# Patient Record
Sex: Female | Born: 1980 | Race: White | Hispanic: No | Marital: Married | Smoking: Never smoker
Health system: Southern US, Community
[De-identification: ages and names within clinical notes are randomized; demographics above are authoritative.]

## PROBLEM LIST (undated history)

## (undated) DIAGNOSIS — G43909 Migraine, unspecified, not intractable, without status migrainosus: Secondary | ICD-10-CM

## (undated) DIAGNOSIS — E079 Disorder of thyroid, unspecified: Secondary | ICD-10-CM

## (undated) HISTORY — PX: WISDOM TOOTH EXTRACTION: SHX21

---

## 2010-05-04 ENCOUNTER — Other Ambulatory Visit
Admission: RE | Admit: 2010-05-04 | Discharge: 2010-05-04 | Payer: Self-pay | Source: Home / Self Care | Admitting: Family Medicine

## 2014-01-17 ENCOUNTER — Encounter (HOSPITAL_COMMUNITY): Payer: Self-pay | Admitting: Emergency Medicine

## 2014-01-17 ENCOUNTER — Emergency Department (HOSPITAL_COMMUNITY)
Admission: EM | Admit: 2014-01-17 | Discharge: 2014-01-18 | Disposition: A | Discharge status: Home / Self Care | Payer: Managed Care, Other (non HMO) | Attending: Emergency Medicine | Admitting: Emergency Medicine

## 2014-01-17 DIAGNOSIS — Z8639 Personal history of other endocrine, nutritional and metabolic disease: Secondary | ICD-10-CM | POA: Diagnosis not present

## 2014-01-17 DIAGNOSIS — G43909 Migraine, unspecified, not intractable, without status migrainosus: Secondary | ICD-10-CM | POA: Insufficient documentation

## 2014-01-17 DIAGNOSIS — R51 Headache: Secondary | ICD-10-CM | POA: Insufficient documentation

## 2014-01-17 DIAGNOSIS — Z862 Personal history of diseases of the blood and blood-forming organs and certain disorders involving the immune mechanism: Secondary | ICD-10-CM | POA: Insufficient documentation

## 2014-01-17 DIAGNOSIS — R519 Headache, unspecified: Secondary | ICD-10-CM

## 2014-01-17 HISTORY — DX: Disorder of thyroid, unspecified: E07.9

## 2014-01-17 HISTORY — DX: Migraine, unspecified, not intractable, without status migrainosus: G43.909

## 2014-01-17 MED ORDER — KETOROLAC TROMETHAMINE 30 MG/ML IJ SOLN
30.0000 mg | Freq: Once | INTRAMUSCULAR | Status: AC
  Administered 2014-01-17: 30 mg via INTRAVENOUS
  Filled 2014-01-17: qty 1

## 2014-01-17 MED ORDER — METOCLOPRAMIDE HCL 5 MG/ML IJ SOLN
10.0000 mg | Freq: Once | INTRAMUSCULAR | Status: AC
Start: 2014-01-17 — End: 2014-01-17
  Administered 2014-01-17: 10 mg via INTRAVENOUS
  Filled 2014-01-17: qty 2

## 2014-01-17 MED ORDER — SODIUM CHLORIDE 0.9 % IV BOLUS (SEPSIS)
1000.0000 mL | Freq: Once | INTRAVENOUS | Status: AC
  Administered 2014-01-17: 1000 mL via INTRAVENOUS

## 2014-01-17 NOTE — Discharge Instructions (Signed)

## 2014-01-17 NOTE — ED Provider Notes (Signed)
CSN: [id number]     Arrival date & time 01/17/14  2027 History   First MD Initiated Contact with Patient 01/17/14 2135     Chief Complaint  Patient presents with  . Migraine     (Consider location/radiation/quality/duration/timing/severity/associated sxs/prior Treatment) HPI Comments: Patient presents emergency department with chief complaint of headache. She states that she has a history of headaches. She states that she is on multiple this week. She states that this current episode started around 7:00. It has progressively worsened. She states that she is tried taking ibuprofen with little relief. She denies any fevers, chills, neck stiffness, weakness, numbness, slurred speech, or difficulty seeing. She reports associated photophobia and phonophobia.  The history is provided by the patient. No language interpreter was used.    Past Medical History  Diagnosis Date  . Migraine   . Thyroid disease    History reviewed. No pertinent past surgical history. No family history on file. History  Substance Use Topics  . Smoking status: Never Smoker   . Smokeless tobacco: Not on file  . Alcohol Use: No   OB History   Grav Para Term Preterm Abortions TAB SAB Ect Mult Living       0  0 0  0     Review of Systems  Constitutional: Negative for fever and chills.  Respiratory: Negative for shortness of breath.   Cardiovascular: Negative for chest pain.  Gastrointestinal: Negative for nausea, vomiting, diarrhea and constipation.  Genitourinary: Negative for dysuria.  Neurological: Positive for headaches.  All other systems reviewed and are negative.     Allergies  Review of patient's allergies indicates no known allergies.  Home Medications   Prior to Admission medications   Not on File   BP 128/81  Pulse 70  Temp(Src) 98.5 F (36.9 C) (Oral)  Resp 20  Ht  (1.803 m)  Wt 181 lb (82.101 kg)  BMI 25.26 kg/m2  SpO2 100%  LMP 07/17/2013 Physical Exam  Nursing note and  vitals reviewed. Constitutional: She is oriented to person, place, and time. She appears well-developed and well-nourished.  HENT:  Head: Normocephalic and atraumatic.  Right Ear: External ear normal.  Left Ear: External ear normal.  Eyes: Conjunctivae and EOM are normal. Pupils are equal, round, and reactive to light.  Neck: Normal range of motion. Neck supple.  No pain with neck flexion, no meningismus  Cardiovascular: Normal rate, regular rhythm and normal heart sounds.  Exam reveals no gallop and no friction rub.   No murmur heard. Pulmonary/Chest: Effort normal and breath sounds normal. No respiratory distress. She has no wheezes. She has no rales. She exhibits no tenderness.  Abdominal: Soft. She exhibits no distension and no mass. There is no tenderness. There is no rebound and no guarding.  Musculoskeletal: Normal range of motion. She exhibits no edema and no tenderness.  Normal gait.  Neurological: She is alert and oriented to person, place, and time. She has normal reflexes.  CN 3-12 intact, normal finger to nose, no pronator drift, sensation and strength intact bilaterally.  Skin: Skin is warm and dry.  Psychiatric: She has a normal mood and affect. Her behavior is normal. Judgment and thought content normal.    ED Course  Procedures (including critical care time) Labs Review Labs Reviewed - No data to display  Imaging Review No results found.   EKG Interpretation None      MDM   Final diagnoses:  Nonintractable headache, unspecified chronicity pattern, unspecified headache type  Pt HA treated and improved while in ED.  Presentation is like pts typical HA and non concerning for [facility], ICH, Meningitis, or temporal arteritis. Pt is afebrile with no focal neuro deficits, nuchal rigidity, or change in vision. Pt is to follow up with PCP to discuss prophylactic medication. Pt verbalizes understanding and is agreeable with plan to dc.      Roxy Horseman,  PA-C 01/17/14 2340

## 2014-01-17 NOTE — ED Notes (Signed)
Pt reports progressively worsening right sided migraine since AM. Denies vision changes. Reports photophobia. Neuro intact.  Pt AO x4. NAD. Hx: Migraines.

## 2014-01-17 NOTE — ED Provider Notes (Signed)
  Medical screening examination/treatment/procedure(s) were performed by non-physician practitioner and as supervising physician I was immediately available for consultation/collaboration.   EKG Interpretation None         Preeti Winegardner, MD 01/17/14 2344 

## 2014-08-16 ENCOUNTER — Other Ambulatory Visit: Payer: Self-pay | Admitting: Family Medicine

## 2014-08-16 ENCOUNTER — Other Ambulatory Visit (HOSPITAL_COMMUNITY)
Admission: RE | Admit: 2014-08-16 | Discharge: 2014-08-16 | Disposition: A | Discharge status: Home / Self Care | Payer: Managed Care, Other (non HMO) | Source: Ambulatory Visit | Attending: Family Medicine | Admitting: Family Medicine

## 2014-08-16 DIAGNOSIS — Z01419 Encounter for gynecological examination (general) (routine) without abnormal findings: Secondary | ICD-10-CM | POA: Diagnosis present

## 2014-08-19 LAB — CYTOLOGY - PAP

## 2018-08-21 DIAGNOSIS — E039 Hypothyroidism, unspecified: Secondary | ICD-10-CM | POA: Insufficient documentation

## 2019-04-30 ENCOUNTER — Other Ambulatory Visit: Payer: Self-pay

## 2019-04-30 DIAGNOSIS — Z20822 Contact with and (suspected) exposure to covid-19: Secondary | ICD-10-CM

## 2019-05-01 LAB — NOVEL CORONAVIRUS, NAA: SARS-CoV-2, NAA: NOT DETECTED

## 2019-07-09 ENCOUNTER — Emergency Department (HOSPITAL_BASED_OUTPATIENT_CLINIC_OR_DEPARTMENT_OTHER): Payer: Managed Care, Other (non HMO)

## 2019-07-09 ENCOUNTER — Encounter (HOSPITAL_BASED_OUTPATIENT_CLINIC_OR_DEPARTMENT_OTHER): Payer: Self-pay | Admitting: *Deleted

## 2019-07-09 ENCOUNTER — Other Ambulatory Visit: Payer: Self-pay

## 2019-07-09 ENCOUNTER — Emergency Department (HOSPITAL_BASED_OUTPATIENT_CLINIC_OR_DEPARTMENT_OTHER)
Admission: EM | Admit: 2019-07-09 | Discharge: 2019-07-09 | Disposition: A | Discharge status: Home / Self Care | Payer: Managed Care, Other (non HMO) | Attending: Emergency Medicine | Admitting: Emergency Medicine

## 2019-07-09 DIAGNOSIS — Z20822 Contact with and (suspected) exposure to covid-19: Secondary | ICD-10-CM | POA: Diagnosis not present

## 2019-07-09 DIAGNOSIS — R0789 Other chest pain: Secondary | ICD-10-CM | POA: Insufficient documentation

## 2019-07-09 DIAGNOSIS — M549 Dorsalgia, unspecified: Secondary | ICD-10-CM | POA: Insufficient documentation

## 2019-07-09 DIAGNOSIS — R0602 Shortness of breath: Secondary | ICD-10-CM | POA: Insufficient documentation

## 2019-07-09 LAB — CBC
HCT: 40.7 % (ref 36.0–46.0)
Hemoglobin: 12.7 g/dL (ref 12.0–15.0)
MCH: 28.3 pg (ref 26.0–34.0)
MCHC: 31.2 g/dL (ref 30.0–36.0)
MCV: 90.8 fL (ref 80.0–100.0)
Platelets: 308 10*3/uL (ref 150–400)
RBC: 4.48 MIL/uL (ref 3.87–5.11)
RDW: 13.3 % (ref 11.5–15.5)
WBC: 8.9 10*3/uL (ref 4.0–10.5)
nRBC: 0 % (ref 0.0–0.2)

## 2019-07-09 LAB — URINALYSIS, ROUTINE W REFLEX MICROSCOPIC
Bilirubin Urine: NEGATIVE
Glucose, UA: NEGATIVE mg/dL
Ketones, ur: NEGATIVE mg/dL
Leukocytes,Ua: NEGATIVE
Nitrite: NEGATIVE
Protein, ur: NEGATIVE mg/dL
Specific Gravity, Urine: >1.03 — ABNORMAL HIGH (ref 1.005–1.030)
pH: 5.5 (ref 5.0–8.0)

## 2019-07-09 LAB — URINALYSIS, MICROSCOPIC (REFLEX)

## 2019-07-09 LAB — BASIC METABOLIC PANEL
Anion gap: 11 (ref 5–15)
BUN: 18 mg/dL (ref 6–20)
CO2: 24 mmol/L (ref 22–32)
Calcium: 9.4 mg/dL (ref 8.9–10.3)
Chloride: 102 mmol/L (ref 98–111)
Creatinine, Ser: 0.63 mg/dL (ref 0.44–1.00)
GFR calc Af Amer: >60 mL/min (ref >60)
GFR calc non Af Amer: >60 mL/min (ref >60)
Glucose, Bld: 113 mg/dL — ABNORMAL HIGH (ref 70–99)
Potassium: 3.8 mmol/L (ref 3.5–5.1)
Sodium: 137 mmol/L (ref 135–145)

## 2019-07-09 LAB — TROPONIN I (HIGH SENSITIVITY)
Troponin I (High Sensitivity): <2 ng/L (ref <18)
Troponin I (High Sensitivity): <2 ng/L (ref <18)

## 2019-07-09 LAB — PREGNANCY, URINE: Preg Test, Ur: NEGATIVE

## 2019-07-09 LAB — D-DIMER, QUANTITATIVE: D-Dimer, Quant: <0.27 ug/mL-FEU (ref 0.00–0.50)

## 2019-07-09 MED ORDER — KETOROLAC TROMETHAMINE 30 MG/ML IJ SOLN
30.0000 mg | Freq: Once | INTRAMUSCULAR | Status: AC
  Administered 2019-07-09: 30 mg via INTRAVENOUS
  Filled 2019-07-09: qty 1

## 2019-07-09 MED ORDER — IOHEXOL 350 MG/ML SOLN
75.0000 mL | Freq: Once | INTRAVENOUS | Status: AC | PRN
  Administered 2019-07-09: 75 mL via INTRAVENOUS

## 2019-07-09 MED ORDER — HYDROCODONE-ACETAMINOPHEN 5-325 MG PO TABS: 1.0000 | ORAL_TABLET | ORAL | 0 refills | Status: DC | PRN

## 2019-07-09 MED ORDER — SODIUM CHLORIDE 0.9% FLUSH
3.0000 mL | Freq: Once | INTRAVENOUS | Status: DC
  Filled 2019-07-09: qty 3

## 2019-07-09 NOTE — ED Provider Notes (Signed)
[facility] EMERGENCY DEPARTMENT Provider Note   CSN: [id number] Arrival date & time: 07/09/19  1658     History Chief Complaint  Patient presents with  . Chest Pain    Chelsea Fields is a 39 y.o. female.  Pt presents to the ED today with back pain and chest pain.  Pt said she had back pain start by her shoulder blades about 2 days ago.  She developed cp and sob about 2 hrs pta.  Pt said she took 800 mg Advil, but it did not help.  Pain is worse with movement.  No f/c.  No cough.  No known Covid exposures.  No trauma or new exercise.            Past Medical History:  Diagnosis Date  . Thyroid disease     There are no problems to display for this patient.   History reviewed. No pertinent surgical history.   OB History   No obstetric history on file.     History reviewed. No pertinent family history.  Social History   Tobacco Use  . Smoking status: Never Smoker  . Smokeless tobacco: Never Used  Substance Use Topics  . Alcohol use: Not Currently  . Drug use: Never    Home Medications Prior to Admission medications   Medication Sig Start Date End Date Taking? Authorizing Provider  thyroid (ARMOUR) 15 MG tablet Take 15 mg by mouth daily.   Yes [provider]  HYDROcodone-acetaminophen (NORCO/VICODIN) 5-325 MG tablet Take 1 tablet by mouth every 4 (four) hours as needed. 07/09/19   Jacalyn Lefevre, MD    Allergies    Patient has no known allergies.  Review of Systems   Review of Systems  Respiratory: Positive for chest tightness.   Musculoskeletal: Positive for back pain.  All other systems reviewed and are negative.   Physical Exam Updated Vital Signs BP 114/80   Pulse 67   Temp 98.6 F (37 C) (Oral)   Resp 13   Ht 5\' 11"  (1.803 m)   Wt 86.2 kg   LMP 06/25/2019 (Approximate)   SpO2 100%   BMI 26.50 kg/m   Physical Exam Vitals and nursing note reviewed.  Constitutional:      Appearance: She is well-developed.  HENT:       Head: Normocephalic and atraumatic.  Eyes:     Extraocular Movements: Extraocular movements intact.     Pupils: Pupils are equal, round, and reactive to light.  Cardiovascular:     Rate and Rhythm: Normal rate and regular rhythm.     Heart sounds: Normal heart sounds.  Pulmonary:     Effort: Pulmonary effort is normal.     Breath sounds: Normal breath sounds.  Abdominal:     Palpations: Abdomen is soft.  Musculoskeletal:        General: Normal range of motion.     Cervical back: Normal range of motion and neck supple.  Skin:    General: Skin is warm.     Capillary Refill: Capillary refill takes less than 2 seconds.  Neurological:     General: No focal deficit present.     Mental Status: She is alert and oriented to person, place, and time.  Psychiatric:        Mood and Affect: Mood normal.        Behavior: Behavior normal.     ED Results / Procedures / Treatments   Labs (all labs ordered are listed, but only abnormal results  are displayed) Labs Reviewed  BASIC METABOLIC PANEL - Abnormal; Notable for the following components:      Result Value   Glucose, Bld 113 (*)    All other components within normal limits  URINALYSIS, ROUTINE W REFLEX MICROSCOPIC - Abnormal; Notable for the following components:   Specific Gravity, Urine >1.030 (*)    Hgb urine dipstick LARGE (*)    All other components within normal limits  URINALYSIS, MICROSCOPIC (REFLEX) - Abnormal; Notable for the following components:   Bacteria, UA MANY (*)    All other components within normal limits  CBC  PREGNANCY, URINE  D-DIMER, QUANTITATIVE (NOT AT [facility])  TROPONIN I (HIGH SENSITIVITY)  TROPONIN I (HIGH SENSITIVITY)    EKG EKG Interpretation  Date/Time:  Friday July 09 2019 17:06:26 EST Ventricular Rate:  75 PR Interval:  134 QRS Duration: 86 QT Interval:  382 QTC Calculation: 426 R Axis:   30 Text Interpretation: Normal sinus rhythm with sinus arrhythmia Normal ECG No old tracing to  compare Confirmed by Jacalyn Lefevre [phone]) on 07/09/2019 6:54:39 PM   Radiology DG Chest 2 View  Result Date: 07/09/2019 CLINICAL DATA:  Chest pain EXAM: CHEST - 2 VIEW COMPARISON:  None. FINDINGS: The heart size and mediastinal contours are within normal limits. Both lungs are clear. The visualized skeletal structures are unremarkable. IMPRESSION: No active cardiopulmonary disease. Electronically Signed   By: Deatra Robinson M.D.   On: 07/09/2019 17:56   CT Angio Chest PE W and/or Wo Contrast  Result Date: 07/09/2019 CLINICAL DATA:  39 year old female with chest pain. EXAM: CT ANGIOGRAPHY CHEST WITH CONTRAST TECHNIQUE: Multidetector CT imaging of the chest was performed using the standard protocol during bolus administration of intravenous contrast. Multiplanar CT image reconstructions and MIPs were obtained to evaluate the vascular anatomy. CONTRAST:  103mL OMNIPAQUE IOHEXOL 350 MG/ML SOLN COMPARISON:  Chest radiographs earlier today. FINDINGS: Cardiovascular: Adequate contrast bolus timing in the pulmonary arterial tree. No focal filling defect identified in the pulmonary arteries to suggest acute pulmonary embolism. No calcified coronary artery atherosclerosis is evident. No cardiomegaly or pericardial effusion. Negative visible aorta; left vertebral artery arises directly from the arch (normal variant). Mediastinum/Nodes: Negative. No lymphadenopathy. Lungs/Pleura: Major airways are patent. Mild dependent atelectasis but otherwise both lungs are clear. No pleural effusion. Upper Abdomen: Negative visible liver, gallbladder, spleen, pancreas, adrenal glands, kidneys and bowel in the upper abdomen. Musculoskeletal: Negative. Review of the MIP images confirms the above findings. IMPRESSION: Negative for acute pulmonary embolus, negative Chest CTA. Electronically Signed   By: Odessa Fleming M.D.   On: 07/09/2019 19:49    Procedures Procedures (including critical care time)  Medications Ordered in  ED Medications  ketorolac (TORADOL) 30 MG/ML injection 30 mg (30 mg Intravenous Given 07/09/19 1921)  iohexol (OMNIPAQUE) 350 MG/ML injection 75 mL (75 mLs Intravenous Contrast Given 07/09/19 1935)    ED Course  I have reviewed the triage vital signs and the nursing notes.  Pertinent labs & imaging results that were available during my care of the patient were reviewed by me and considered in my medical decision making (see chart for details).    MDM Rules/Calculators/A&P                      Pt's pain is atypical for cardiac.  Heart score 0.  2 negative troponins.  CTA neg for PE.  Pain is likely MSK.  Pt knows to return if worse.  F/u with pcp.  Final Clinical  Impression(s) / ED Diagnoses Final diagnoses:  Atypical chest pain    Rx / DC Orders ED Discharge Orders         Ordered    HYDROcodone-acetaminophen (NORCO/VICODIN) 5-325 MG tablet  Every 4 hours PRN     07/09/19 2057           Isla Pence, MD 07/09/19 2059

## 2019-07-09 NOTE — ED Triage Notes (Signed)
C/o back pain x 2 days  Onset 2 hours ago of generalized chest pressure w sob,  Denies n/v  No radiation,  Pain increased w movement

## 2019-07-10 LAB — SARS CORONAVIRUS 2 (TAT 6-24 HRS): SARS Coronavirus 2: NEGATIVE

## 2019-07-13 ENCOUNTER — Encounter (HOSPITAL_COMMUNITY): Payer: Self-pay | Admitting: Emergency Medicine

## 2021-02-10 IMAGING — DX DG CHEST 2V
2 series · 2 of 2 positions shown · non-contrast
Comparison: None.

CLINICAL DATA: Chest pain

EXAM:
CHEST - 2 VIEW

[chest pa]
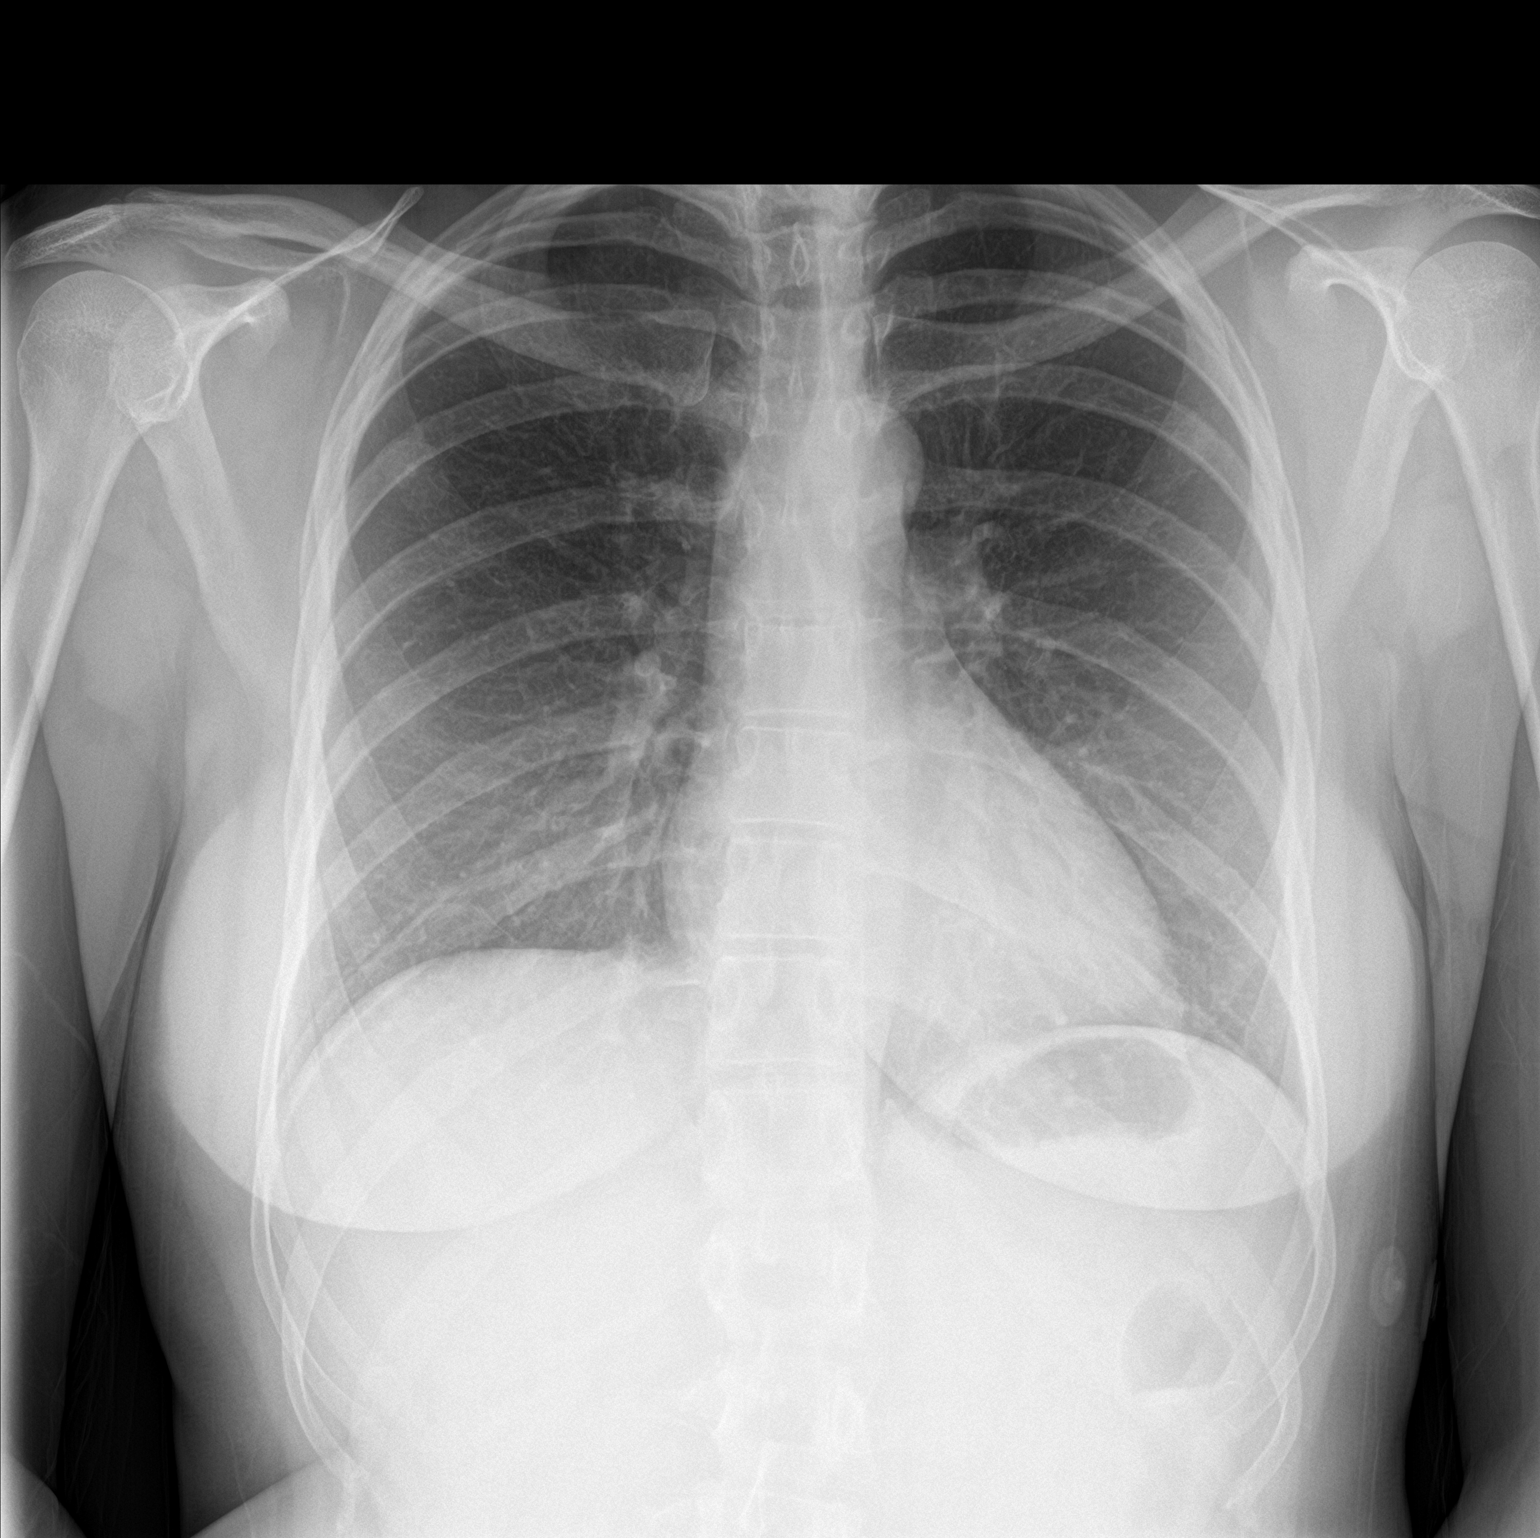

[chest lat]
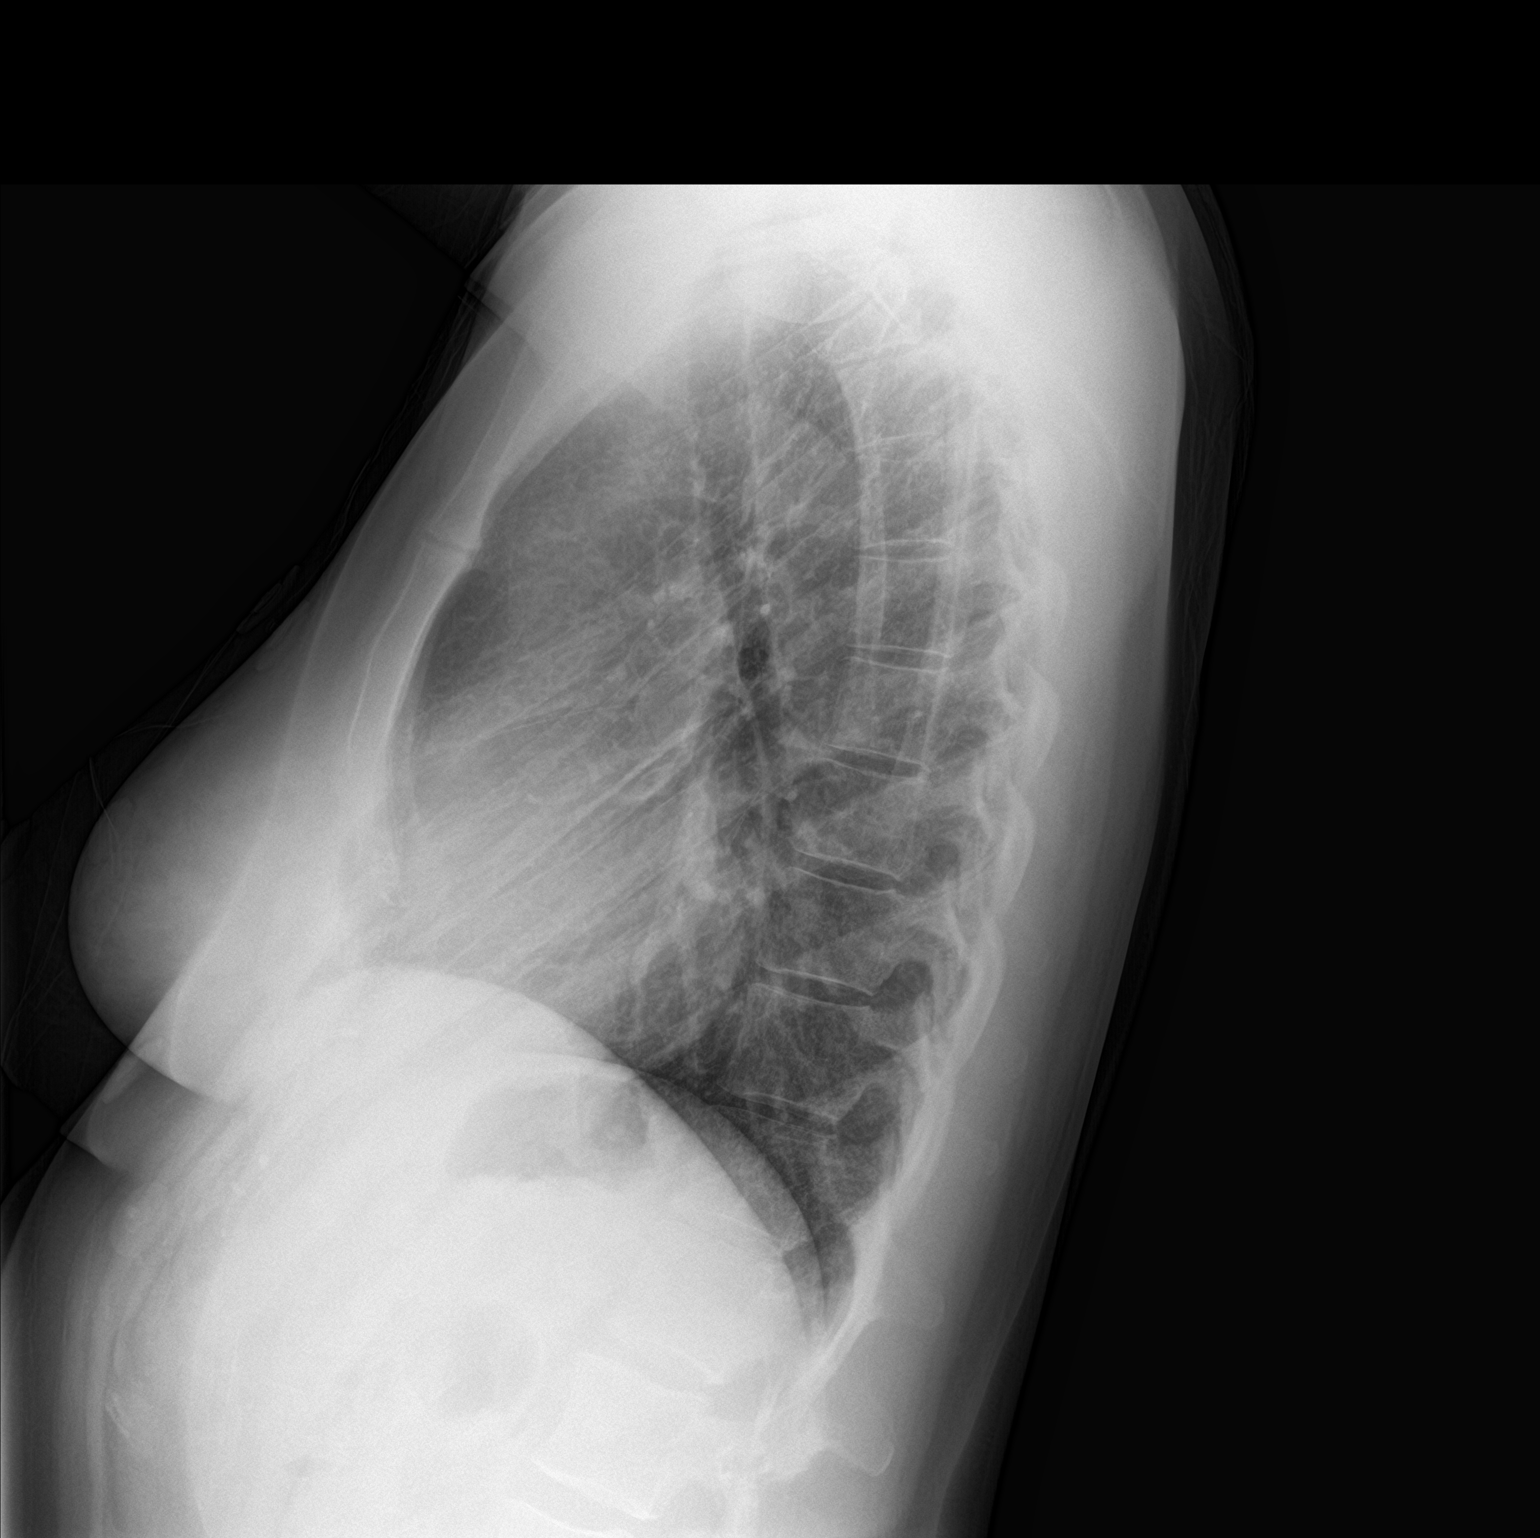

[2 of 2 positions shown; findings below may reference images not displayed]

FINDINGS: The heart size and mediastinal contours are within normal limits.
Both lungs are clear. The visualized skeletal structures are
unremarkable.
IMPRESSION: No active cardiopulmonary disease.

## 2021-05-17 ENCOUNTER — Telehealth: Payer: Managed Care, Other (non HMO) | Admitting: Physician Assistant

## 2021-05-17 DIAGNOSIS — J019 Acute sinusitis, unspecified: Secondary | ICD-10-CM

## 2021-05-17 DIAGNOSIS — B9689 Other specified bacterial agents as the cause of diseases classified elsewhere: Secondary | ICD-10-CM | POA: Diagnosis not present

## 2021-05-17 MED ORDER — DOXYCYCLINE HYCLATE 100 MG PO TABS: 100.0000 mg | ORAL_TABLET | Freq: Two times a day (BID) | ORAL | 0 refills | Status: DC

## 2021-05-17 NOTE — Progress Notes (Signed)
Virtual Visit Consent   Chelsea Fields, you are scheduled for a virtual visit with a [facility] provider today.     Just as with appointments in the office, your consent must be obtained to participate.  Your consent will be active for this visit and any virtual visit you may have with one of our providers in the next 365 days.     If you have a MyChart account, a copy of this consent can be sent to you electronically.  All virtual visits are billed to your insurance company just like a traditional visit in the office.    As this is a virtual visit, video technology does not allow for your provider to perform a traditional examination.  This may limit your provider's ability to fully assess your condition.  If your provider identifies any concerns that need to be evaluated in person or the need to arrange testing (such as labs, EKG, etc.), we will make arrangements to do so.     Although advances in technology are sophisticated, we cannot ensure that it will always work on either your end or our end.  If the connection with a video visit is poor, the visit may have to be switched to a telephone visit.  With either a video or telephone visit, we are not always able to ensure that we have a secure connection.     I need to obtain your verbal consent now.   Are you willing to proceed with your visit today?    Chelsea Fields has provided verbal consent on 05/17/2021 for a virtual visit (video or telephone).   [name], [place]   Date: 05/17/2021 5:23 PM   Virtual Visit via Video Note   I, [name], connected with  Chelsea Fields  ([id number], 1981-01-26) on 05/17/21 at  5:15 PM EST by a video-enabled telemedicine application and verified that I am speaking with the correct person using two identifiers.  Location: Patient: Virtual Visit Location Patient: Home Provider: Virtual Visit Location Provider: Home Office   I discussed the limitations of evaluation and management  by telemedicine and the availability of in person appointments. The patient expressed understanding and agreed to proceed.    History of Present Illness: Chelsea Fields is a 39 y.o. who identifies as a female who was assigned female at birth, and is being seen today for possible sinusitis. Notes a history of sinusitis and this feels similar to her. Notes symptoms starting in the past couple of weeks, initially improving and now worsening again. Denies fever, chest congestion or SOB. Notes substantial sinus headache. Has been taking OTC Advil and Sudafed which is only helping slightly. Denies recent travel or sick contact.   HPI: HPI  Problems:  Patient Active Problem List   Diagnosis Date Noted   Hypothyroidism 08/21/2018    Allergies:  Allergies  Allergen Reactions   Amoxicillin Nausea Only and Nausea And Vomiting   Medications:  Current Outpatient Medications:    doxycycline (VIBRA-TABS) 100 MG tablet, Take 1 tablet (100 mg total) by mouth 2 (two) times daily., Disp: 20 tablet, Rfl: 0   thyroid (ARMOUR THYROID) 30 MG tablet, Armour Thyroid 30 mg tablet  TAKE ONE TABLET BY MOUTH DAILY, Disp: , Rfl:   Observations/Objective: Patient is well-developed, well-nourished in no acute distress.  Resting comfortably at home.  Head is normocephalic, atraumatic.  No labored breathing. Speech is clear and coherent with logical content.  Patient is alert and oriented at baseline.  Assessment and Plan: 1. Acute bacterial sinusitis - doxycycline (VIBRA-TABS) 100 MG tablet; Take 1 tablet (100 mg total) by mouth 2 (two) times daily.  Dispense: 20 tablet; Refill: 0  Rx Doxycycline.  Increase fluids.  Rest.  Saline nasal spray.  Probiotic.  Mucinex as directed.  Humidifier in bedroom. Flonase OTC.  Call or return to clinic if symptoms are not improving.   Follow Up Instructions: I discussed the assessment and treatment plan with the patient. The patient was provided an opportunity to ask  questions and all were answered. The patient agreed with the plan and demonstrated an understanding of the instructions.  A copy of instructions were sent to the patient via MyChart unless otherwise noted below.   The patient was advised to call back or seek an in-person evaluation if the symptoms worsen or if the condition fails to improve as anticipated.  Time:  I spent 10 minutes with the patient via telehealth technology discussing the above problems/concerns.    [name], PA-C

## 2021-05-17 NOTE — Patient Instructions (Signed)
[name], thank you for joining [name], PA-C for today's virtual visit.  While this provider is not your primary care provider (PCP), if your PCP is located in our provider database this encounter information will be shared with them immediately following your visit.  Consent: (Patient) Chelsea Fields provided verbal consent for this virtual visit at the beginning of the encounter.  Current Medications:  Current Outpatient Medications:    thyroid (ARMOUR THYROID) 30 MG tablet, Armour Thyroid 30 mg tablet  TAKE ONE TABLET BY MOUTH DAILY, Disp: , Rfl:    Medications ordered in this encounter:  No orders of the defined types were placed in this encounter.    *If you need refills on other medications prior to your next appointment, please contact your pharmacy*  Follow-Up: Call back or seek an in-person evaluation if the symptoms worsen or if the condition fails to improve as anticipated.  Other Instructions Please take antibiotic as directed.  Increase fluid intake.  Use Saline nasal spray.  Take a daily multivitamin. Use OTC Flonase to help.  Place a humidifier in the bedroom.  Please call or return clinic if symptoms are not improving.  Sinusitis Sinusitis is redness, soreness, and swelling (inflammation) of the paranasal sinuses. Paranasal sinuses are air pockets within the bones of your face (beneath the eyes, the middle of the forehead, or above the eyes). In healthy paranasal sinuses, mucus is able to drain out, and air is able to circulate through them by way of your nose. However, when your paranasal sinuses are inflamed, mucus and air can become trapped. This can allow bacteria and other germs to grow and cause infection. Sinusitis can develop quickly and last only a short time (acute) or continue over a long period (chronic). Sinusitis that lasts for more than 12 weeks is considered chronic.  CAUSES  Causes of sinusitis include: Allergies. Structural  abnormalities, such as displacement of the cartilage that separates your nostrils (deviated septum), which can decrease the air flow through your nose and sinuses and affect sinus drainage. Functional abnormalities, such as when the small hairs (cilia) that line your sinuses and help remove mucus do not work properly or are not present. SYMPTOMS  Symptoms of acute and chronic sinusitis are the same. The primary symptoms are pain and pressure around the affected sinuses. Other symptoms include: Upper toothache. Earache. Headache. Bad breath. Decreased sense of smell and taste. A cough, which worsens when you are lying flat. Fatigue. Fever. Thick drainage from your nose, which often is green and may contain pus (purulent). Swelling and warmth over the affected sinuses. DIAGNOSIS  Your caregiver will perform a physical exam. During the exam, your caregiver may: Look in your nose for signs of abnormal growths in your nostrils (nasal polyps). Tap over the affected sinus to check for signs of infection. View the inside of your sinuses (endoscopy) with a special imaging device with a light attached (endoscope), which is inserted into your sinuses. If your caregiver suspects that you have chronic sinusitis, one or more of the following tests may be recommended: Allergy tests. Nasal culture A sample of mucus is taken from your nose and sent to a lab and screened for bacteria. Nasal cytology A sample of mucus is taken from your nose and examined by your caregiver to determine if your sinusitis is related to an allergy. TREATMENT  Most cases of acute sinusitis are related to a viral infection and will resolve on their own within 10 days. Sometimes medicines  are prescribed to help relieve symptoms (pain medicine, decongestants, nasal steroid sprays, or saline sprays).  However, for sinusitis related to a bacterial infection, your caregiver will prescribe antibiotic medicines. These are medicines that  will help kill the bacteria causing the infection.  Rarely, sinusitis is caused by a fungal infection. In theses cases, your caregiver will prescribe antifungal medicine. For some cases of chronic sinusitis, surgery is needed. Generally, these are cases in which sinusitis recurs more than 3 times per year, despite other treatments. HOME CARE INSTRUCTIONS  Drink plenty of water. Water helps thin the mucus so your sinuses can drain more easily. Use a humidifier. Inhale steam 3 to 4 times a day (for example, sit in the bathroom with the shower running). Apply a warm, moist washcloth to your face 3 to 4 times a day, or as directed by your caregiver. Use saline nasal sprays to help moisten and clean your sinuses. Take over-the-counter or prescription medicines for pain, discomfort, or fever only as directed by your caregiver. SEEK IMMEDIATE MEDICAL CARE IF: You have increasing pain or severe headaches. You have nausea, vomiting, or drowsiness. You have swelling around your face. You have vision problems. You have a stiff neck. You have difficulty breathing. MAKE SURE YOU:  Understand these instructions. Will watch your condition. Will get help right away if you are not doing well or get worse. Document Released: 05/06/2005 Document Revised: 07/29/2011 Document Reviewed: 05/21/2011 [facility] Patient Information 2014 [place], [place].    If you have been instructed to have an in-person evaluation today at a local Urgent Care facility, please use the link below. It will take you to a list of all of our available [facility], including address, phone number and hours of operation. Please do not delay care.  [facility]  If you or a family member do not have a primary care provider, use the link below to schedule a visit and establish care. When you choose a [facility] primary care physician or advanced practice provider, you gain a long-term partner in health. Find a  Primary Care Provider  Learn more about [facility]'s in-office and virtual care options: [facility] - Get Care Now

## 2022-06-07 ENCOUNTER — Encounter: Payer: Self-pay | Admitting: Family

## 2022-06-07 ENCOUNTER — Ambulatory Visit (INDEPENDENT_AMBULATORY_CARE_PROVIDER_SITE_OTHER): Payer: Managed Care, Other (non HMO) | Admitting: Family

## 2022-06-07 VITALS — BP 107/74 | HR 75 | Temp 96.9°F | Ht 71.0 in | Wt 211.5 lb

## 2022-06-07 DIAGNOSIS — E039 Hypothyroidism, unspecified: Secondary | ICD-10-CM

## 2022-06-07 MED ORDER — THYROID 30 MG PO TABS: ORAL_TABLET | ORAL | 2 refills | Status: DC

## 2022-06-07 NOTE — Assessment & Plan Note (Signed)
chronic - dx in her 1s taking Armour thyroid 30mg , states Levo caused HA last labs a year ago sending refill f/u in 92mos w/ physical

## 2022-06-07 NOTE — Progress Notes (Signed)
   New Patient Office Visit  Subjective:  Patient ID: Chelsea Fields, female    DOB: 1980-12-27  Age: 42 y.o. MRN: [id number]  CC:  Chief Complaint  Patient presents with   New Patient (Initial Visit)   Hypothyroidism    Medication management, Labs done 1 year ago.     HPI [name] presents for establishing care today.  HPI: Hypothyroidism: Patient presents today for followup of Hypothyroidism.  Patient reports fair compliance with daily medication. She ran out about 2 mos ago & unable to get appt w/her previous PCP. Reports feeling more tired even prior to running out of med. Patient denies any of the following symptoms:  cold intolerance, constipation, weight gain or inability to lose weight, muscle weakness, mental slowing, dry hair and skin. Last TSH and free T4: checked a year ago.   Assessment & Plan:   Problem List Items Addressed This Visit       Endocrine   Hypothyroidism - Primary    chronic - dx in her 9s taking Armour thyroid 30mg , states Levo caused HA last labs a year ago sending refill f/u in 44mos w/ physical       Relevant Medications   thyroid (ARMOUR THYROID) 30 MG tablet    Subjective:    Outpatient Medications Prior to Visit  Medication Sig Dispense Refill   doxycycline (VIBRA-TABS) 100 MG tablet Take 1 tablet (100 mg total) by mouth 2 (two) times daily. 20 tablet 0   thyroid (ARMOUR THYROID) 30 MG tablet Armour Thyroid 30 mg tablet  TAKE ONE TABLET BY MOUTH DAILY     No facility-administered medications prior to visit.   Past Medical History:  Diagnosis Date   Migraine    Thyroid disease    Past Surgical History:  Procedure Laterality Date   WISDOM TOOTH EXTRACTION      Objective:   Today's Vitals: BP 107/74 (BP Location: Left Arm, Patient Position: Sitting, Cuff Size: Large)   Pulse 75   Temp (!) 96.9 F (36.1 C) (Temporal)   Ht 5\' 11"  (1.803 m)   Wt 211 lb 8 oz (95.9 kg)   LMP 05/24/2022 (Approximate)   SpO2 96%    BMI 29.50 kg/m   Physical Exam Vitals and nursing note reviewed.  Constitutional:      Appearance: Normal appearance.  Cardiovascular:     Rate and Rhythm: Normal rate and regular rhythm.  Pulmonary:     Effort: Pulmonary effort is normal.     Breath sounds: Normal breath sounds.  Musculoskeletal:        General: Normal range of motion.  Skin:    General: Skin is warm and dry.  Neurological:     Mental Status: She is alert.  Psychiatric:        Mood and Affect: Mood normal.        Behavior: Behavior normal.    Meds ordered this encounter  Medications   thyroid (ARMOUR THYROID) 30 MG tablet    Sig: Armour Thyroid 30 mg tablet  TAKE ONE TABLET BY MOUTH DAILY    Dispense:  30 tablet    Refill:  2    Order Specific Question:   Supervising Provider    Answer:   ANDY, CAMILLE L [[id number]]    Jeanie Sewer, NP

## 2022-06-07 NOTE — Patient Instructions (Signed)
Welcome to [facility], It was a pleasure meeting you today!    As discussed, I have sent your refill to your pharmacy.   Please schedule a 2 month follow up visit today and this can be a physical with fasting labs.    PLEASE NOTE: If you had any LAB tests please let us know if you have not heard back within a few days. You may see your results on MyChart before we have a chance to review them but we will give you a call once they are reviewed by [place]. If we ordered any REFERRALS today, please let us know if you have not heard from their office within the next week.  Let us know through MyChart if you are needing REFILLS, or have your pharmacy send [place] the request. You can also use MyChart to communicate with me or any office staff.  Please try these tips to maintain a healthy lifestyle: It is important that you exercise regularly at least 30 minutes 5 times a week. Think about what you will eat, plan ahead. Choose whole foods, & think  "clean, green, fresh or frozen" over canned, processed or packaged foods which are more sugary, salty, and fatty. 70 to 75% of food eaten should be fresh vegetables and protein. 2-3  meals daily with healthy snacks between meals, but must be whole fruit, protein or vegetables. Aim to eat over a 10 hour period when you are active, for example, 7am to 5pm, and then STOP after your last meal of the day, drinking only water.  Shorter eating windows, 6-8 hours, are showing benefits in heart disease and blood sugar regulation. Drink water every day! Shoot for 64 ounces daily = 8 cups, no other drink is as healthy! Fruit juice is best enjoyed in a healthy way, by EATING the fruit.

## 2022-06-12 ENCOUNTER — Telehealth: Payer: Managed Care, Other (non HMO) | Admitting: Nurse Practitioner

## 2022-06-12 DIAGNOSIS — J029 Acute pharyngitis, unspecified: Secondary | ICD-10-CM | POA: Diagnosis not present

## 2022-06-12 DIAGNOSIS — J069 Acute upper respiratory infection, unspecified: Secondary | ICD-10-CM | POA: Diagnosis not present

## 2022-06-12 MED ORDER — CEFDINIR 300 MG PO CAPS: 300.0000 mg | ORAL_CAPSULE | Freq: Two times a day (BID) | ORAL | 0 refills | Status: DC

## 2022-06-12 NOTE — Patient Instructions (Signed)
1. Take meds as prescribed 2. Use a cool mist humidifier especially during the winter months and when heat has been humid. 3. Use saline nose sprays frequently 4. Saline irrigations of the nose can be very helpful if done frequently.  * 4X daily for 1 week*  * Use of a nettie pot can be helpful with this. Follow directions with this* 5. Drink plenty of fluids 6. Keep thermostat turn down low 7.For any cough or congestion- mucinex or delsym OTC if needed  8. For fever or aces or pains- take tylenol or ibuprofen appropriate for age and weight.  * for fevers greater than 101 orally you may alternate ibuprofen and tylenol every  3 hours.

## 2022-06-12 NOTE — Progress Notes (Signed)
Virtual Visit Consent   Chelsea Fields, you are scheduled for a virtual visit with Mary-Margaret Hassell Done, [place], a [facility] provider, today.     Just as with appointments in the office, your consent must be obtained to participate.  Your consent will be active for this visit and any virtual visit you may have with one of our providers in the next 365 days.     If you have a MyChart account, a copy of this consent can be sent to you electronically.  All virtual visits are billed to your insurance company just like a traditional visit in the office.    As this is a virtual visit, video technology does not allow for your provider to perform a traditional examination.  This may limit your provider's ability to fully assess your condition.  If your provider identifies any concerns that need to be evaluated in person or the need to arrange testing (such as labs, EKG, etc.), we will make arrangements to do so.     Although advances in technology are sophisticated, we cannot ensure that it will always work on either your end or our end.  If the connection with a video visit is poor, the visit may have to be switched to a telephone visit.  With either a video or telephone visit, we are not always able to ensure that we have a secure connection.     I need to obtain your verbal consent now.   Are you willing to proceed with your visit today? YES   Chelsea Fields has provided verbal consent on 06/12/2022 for a virtual visit (video or telephone).   Mary-Margaret Hassell Done, FNP   Date: 06/12/2022 11:18 AM   Virtual Visit via Video Note   I, Mary-Margaret Hassell Done, connected with Chelsea Fields ([id number], [birth year]) on 06/12/22 at 11:15 AM EST by a video-enabled telemedicine application and verified that I am speaking with the correct person using two identifiers.  Location: Patient: Virtual Visit Location Patient: Home Provider: Virtual Visit Location Provider: Mobile   I discussed the limitations of  evaluation and management by telemedicine and the availability of in person appointments. The patient expressed understanding and agreed to proceed.    History of Present Illness: Chelsea Fields is a 42 y.o. who identifies as a female who was assigned female at birth, and is being seen today for uri .  HPI: URI  This is a new problem. Associated symptoms include congestion, coughing (occasional), headaches, rhinorrhea and a sore throat. Pertinent negatives include no sinus pain. She has tried NSAIDs for the symptoms. The treatment provided mild relief.    Review of Systems  HENT:  Positive for congestion, rhinorrhea and sore throat. Negative for sinus pain.   Respiratory:  Positive for cough (occasional).   Neurological:  Positive for headaches.    Problems:  Patient Active Problem List   Diagnosis Date Noted   Hypothyroidism 08/21/2018    Allergies:  Allergies  Allergen Reactions   Amoxicillin Nausea Only and Nausea And Vomiting   Medications:  Current Outpatient Medications:    thyroid (ARMOUR THYROID) 30 MG tablet, Armour Thyroid 30 mg tablet  TAKE ONE TABLET BY MOUTH DAILY, Disp: 30 tablet, Rfl: 2  Observations/Objective: Patient is well-developed, well-nourished in no acute distress.  Resting comfortably  at home.  Head is normocephalic, atraumatic.  No labored breathing.  Speech is clear and coherent with logical content.  Patient is alert and oriented at baseline.  Raspy voice Post oral pharynx erythematous.  Assessment and Plan:  Nevaya Nagele in today with chief complaint of URI   1. Pharyngitis, unspecified etiology 2. URI with cough and congestion 1. Take meds as prescribed 2. Use a cool mist humidifier especially during the winter months and when heat has been humid. 3. Use saline nose sprays frequently 4. Saline irrigations of the nose can be very helpful if done frequently.  * 4X daily for 1 week*  * Use of a nettie pot can be helpful with this.  Follow directions with this* 5. Drink plenty of fluids 6. Keep thermostat turn down low 7.For any cough or congestion- mucinx or delsym as needed for cough 8. For fever or aces or pains- take tylenol or ibuprofen appropriate for age and weight.  * for fevers greater than 101 orally you may alternate ibuprofen and tylenol every  3 hours.    Meds ordered this encounter  Medications   cefdinir (OMNICEF) 300 MG capsule    Sig: Take 1 capsule (300 mg total) by mouth 2 (two) times daily. 1 po BID    Dispense:  20 capsule    Refill:  0    Order Specific Question:   Supervising Provider    Answer:   Chase Picket [[id number]]       Follow Up Instructions: I discussed the assessment and treatment plan with the patient. The patient was provided an opportunity to ask questions and all were answered. The patient agreed with the plan and demonstrated an understanding of the instructions.  A copy of instructions were sent to the patient via MyChart.  The patient was advised to call back or seek an in-person evaluation if the symptoms worsen or if the condition fails to improve as anticipated.  Time:  I spent 5 minutes with the patient via telehealth technology discussing the above problems/concerns.    Mary-Margaret Hassell Done, FNP

## 2022-06-26 ENCOUNTER — Telehealth: Payer: Managed Care, Other (non HMO) | Admitting: Physician Assistant

## 2022-06-26 DIAGNOSIS — R6889 Other general symptoms and signs: Secondary | ICD-10-CM | POA: Diagnosis not present

## 2022-06-26 MED ORDER — OSELTAMIVIR PHOSPHATE 75 MG PO CAPS: 75.0000 mg | ORAL_CAPSULE | Freq: Two times a day (BID) | ORAL | 0 refills | Status: DC

## 2022-06-26 NOTE — Addendum Note (Signed)
Addended by: Brunetta Jeans on: 06/26/2022 07:59 PM   Modules accepted: Orders

## 2022-06-26 NOTE — Progress Notes (Signed)
I have spent 5 minutes in review of e-visit questionnaire, review and updating patient chart, medical decision making and response to patient.   Olivia Royse Cody Delando Satter, PA-C    

## 2022-06-26 NOTE — Progress Notes (Signed)
E visit for Flu like symptoms   We are sorry that you are not feeling well.  Here is how we plan to help! Based on what you have shared with me it looks like you may have a respiratory virus that may be influenza.  Influenza or "the flu" is   an infection caused by a respiratory virus. The flu virus is highly contagious and persons who did not receive their yearly flu vaccination may "catch" the flu from close contact.  We have anti-viral medications to treat the viruses that cause this infection. They are not a "cure" and only shorten the course of the infection. These prescriptions are most effective when they are given within the first 2 days of "flu" symptoms. Antiviral medication are indicated if you have a high risk of complications from the flu. You should  also consider an antiviral medication if you are in close contact with someone who is at risk. These medications can help patients avoid complications from the flu  but have side effects that you should know. Possible side effects from Tamiflu or oseltamivir include nausea, vomiting, diarrhea, dizziness, headaches, eye redness, sleep problems or other respiratory symptoms. You should not take Tamiflu if you have an allergy to oseltamivir or any to the ingredients in Tamiflu.  Based upon your symptoms and potential risk factors I have prescribed Oseltamivir (Tamiflu).  It has been sent to your designated pharmacy.  You will take one 75 mg capsule orally twice a day for the next 5 days. and I recommend that you follow the flu symptoms recommendation that I have listed below.  Please keep well-hydrated and try to get plenty of rest. If you have a humidifier, place it in the bedroom and run it at night. Start a saline nasal rinse for nasal congestion. You can consider use of a nasal steroid spray like Flonase or Nasacort OTC. You can alternate between Tylenol and Ibuprofen if needed for fever, body aches, headache and/or throat pain. Salt  water-gargles and chloraseptic spray can be very beneficial for sore throat. Mucinex-DM for congestion or cough. Please take all prescribed medications as directed.  Remain out of work until fever-free for 24 hours without a fever-reducing medication, and you are feeling better.  You should mask until symptoms are resolved.  If anything worsens despite treatment, you need to be evaluated in-person. Please do not delay care.   ANYONE WHO HAS FLU SYMPTOMS SHOULD: Stay home. The flu is highly contagious and going out or to work exposes others! Be sure to drink plenty of fluids. Water is fine as well as fruit juices, sodas and electrolyte beverages. You may want to stay away from caffeine or alcohol. If you are nauseated, try taking small sips of liquids. How do you know if you are getting enough fluid? Your urine should be a pale yellow or almost colorless. Get rest. Taking a steamy shower or using a humidifier may help nasal congestion and ease sore throat pain. Using a saline nasal spray works much the same way. Cough drops, hard candies and sore throat lozenges may ease your cough. Line up a caregiver. Have someone check on you regularly.   GET HELP RIGHT AWAY IF: You cannot keep down liquids or your medications. You become short of breath Your fell like you are going to pass out or loose consciousness. Your symptoms persist after you have completed your treatment plan MAKE SURE YOU  Understand these instructions. Will watch your condition. Will get help right   away if you are not doing well or get worse.  Your e-visit answers were reviewed by a board certified advanced clinical practitioner to complete your personal care plan.  Depending on the condition, your plan could have included both over the counter or prescription medications.  If there is a problem please reply  once you have received a response from your provider.  Your safety is important to us.  If you have drug allergies  check your prescription carefully.    You can use MyChart to ask questions about today's visit, request a non-urgent call back, or ask for a work or school excuse for 24 hours related to this e-Visit. If it has been greater than 24 hours you will need to follow up with your provider, or enter a new e-Visit to address those concerns.  You will get an e-mail in the next two days asking about your experience.  I hope that your e-visit has been valuable and will speed your recovery. Thank you for using e-visits.  

## 2022-06-26 NOTE — Progress Notes (Signed)
The patient no-showed for appointment despite this provider sending direct link x 2 with no response and waiting for at least 10 minutes from appointment time for patient to join. They will be marked as a NS for this appointment/time.   Tasneem Cormier M Nijee Heatwole, PA-C    

## 2022-08-06 ENCOUNTER — Ambulatory Visit (INDEPENDENT_AMBULATORY_CARE_PROVIDER_SITE_OTHER): Payer: Managed Care, Other (non HMO) | Admitting: Family

## 2022-08-06 ENCOUNTER — Encounter: Payer: Self-pay | Admitting: Family

## 2022-08-06 VITALS — BP 113/78 | HR 63 | Temp 97.4°F | Ht 71.0 in | Wt 210.8 lb

## 2022-08-06 DIAGNOSIS — Z0000 Encounter for general adult medical examination without abnormal findings: Secondary | ICD-10-CM | POA: Diagnosis not present

## 2022-08-06 DIAGNOSIS — E039 Hypothyroidism, unspecified: Secondary | ICD-10-CM

## 2022-08-06 DIAGNOSIS — Z1231 Encounter for screening mammogram for malignant neoplasm of breast: Secondary | ICD-10-CM

## 2022-08-06 LAB — COMPREHENSIVE METABOLIC PANEL
ALT: 11 U/L (ref 0–35)
AST: 11 U/L (ref 0–37)
Albumin: 4.4 g/dL (ref 3.5–5.2)
Alkaline Phosphatase: 47 U/L (ref 39–117)
BUN: 15 mg/dL (ref 6–23)
CO2: 24 mEq/L (ref 19–32)
Calcium: 9.7 mg/dL (ref 8.4–10.5)
Chloride: 106 mEq/L (ref 96–112)
Creatinine, Ser: 0.68 mg/dL (ref 0.40–1.20)
GFR: 109.19 mL/min (ref >60.00)
Glucose, Bld: 94 mg/dL (ref 70–99)
Potassium: 4.6 mEq/L (ref 3.5–5.1)
Sodium: 140 mEq/L (ref 135–145)
Total Bilirubin: 0.7 mg/dL (ref 0.2–1.2)
Total Protein: 7.4 g/dL (ref 6.0–8.3)

## 2022-08-06 LAB — CBC WITH DIFFERENTIAL/PLATELET
Basophils Absolute: 0.1 10*3/uL (ref 0.0–0.1)
Basophils Relative: 0.8 % (ref 0.0–3.0)
Eosinophils Absolute: 0.2 10*3/uL (ref 0.0–0.7)
Eosinophils Relative: 2.2 % (ref 0.0–5.0)
HCT: 39.4 % (ref 36.0–46.0)
Hemoglobin: 12.9 g/dL (ref 12.0–15.0)
Lymphocytes Relative: 32 % (ref 12.0–46.0)
Lymphs Abs: 2.3 10*3/uL (ref 0.7–4.0)
MCHC: 32.6 g/dL (ref 30.0–36.0)
MCV: 87.2 fl (ref 78.0–100.0)
Monocytes Absolute: 0.6 10*3/uL (ref 0.1–1.0)
Monocytes Relative: 8.4 % (ref 3.0–12.0)
Neutro Abs: 4 10*3/uL (ref 1.4–7.7)
Neutrophils Relative %: 56.6 % (ref 43.0–77.0)
Platelets: 301 10*3/uL (ref 150.0–400.0)
RBC: 4.52 Mil/uL (ref 3.87–5.11)
RDW: 13.9 % (ref 11.5–15.5)
WBC: 7.2 10*3/uL (ref 4.0–10.5)

## 2022-08-06 LAB — LIPID PANEL
Cholesterol: 178 mg/dL (ref 0–200)
HDL: 48 mg/dL (ref >39.00)
LDL Cholesterol: 121 mg/dL — ABNORMAL HIGH (ref 0–99)
NonHDL: 130.02
Total CHOL/HDL Ratio: 4
Triglycerides: 47 mg/dL (ref 0.0–149.0)
VLDL: 9.4 mg/dL (ref 0.0–40.0)

## 2022-08-06 LAB — TSH: TSH: 11.11 u[IU]/mL — ABNORMAL HIGH (ref 0.35–5.50)

## 2022-08-06 LAB — T4, FREE: Free T4: 0.61 ng/dL (ref 0.60–1.60)

## 2022-08-06 NOTE — Progress Notes (Signed)
Phone [phone]  Subjective:   Patient is a 42 y.o. female presenting for annual physical.    Chief Complaint  Patient presents with   Annual Exam    Fasting w/ labs   Hypothyroidism   See problem oriented charting- ROS- full  review of systems was completed and negative.  The following were reviewed and entered/updated in epic: Past Medical History:  Diagnosis Date   Migraine    Thyroid disease    Patient Active Problem List   Diagnosis Date Noted   Hypothyroidism 08/21/2018   Past Surgical History:  Procedure Laterality Date   WISDOM TOOTH EXTRACTION      Family History  Problem Relation Age of Onset   Hypertension Father    Hypothyroidism Father    Depression Sister     Medications- reviewed and updated Current Outpatient Medications  Medication Sig Dispense Refill   thyroid (ARMOUR THYROID) 30 MG tablet Armour Thyroid 30 mg tablet  TAKE ONE TABLET BY MOUTH DAILY 30 tablet 2   No current facility-administered medications for this visit.    Allergies-reviewed and updated Allergies  Allergen Reactions   Amoxicillin Nausea Only and Nausea And Vomiting   Social History   Social History Narrative   ** Merged History Encounter **        Objective:  BP 113/78 (BP Location: Left Arm, Patient Position: Sitting, Cuff Size: Large)   Pulse 63   Temp (!) 97.4 F (36.3 C) (Temporal)   Ht 5\' 11"  (1.803 m)   Wt 210 lb 12.8 oz (95.6 kg)   LMP 07/10/2022 (Approximate)   SpO2 99%   BMI 29.40 kg/m  Physical Exam Vitals and nursing note reviewed.  Constitutional:      Appearance: Normal appearance.  HENT:     Head: Normocephalic.     Right Ear: Tympanic membrane normal.     Left Ear: Tympanic membrane normal.     Nose: Nose normal.     Mouth/Throat:     Mouth: Mucous membranes are moist.  Eyes:     Pupils: Pupils are equal, round, and reactive to light.  Cardiovascular:     Rate and Rhythm: Normal rate and regular rhythm.  Pulmonary:     Effort:  Pulmonary effort is normal.     Breath sounds: Normal breath sounds.  Musculoskeletal:        General: Normal range of motion.     Cervical back: Normal range of motion.  Lymphadenopathy:     Cervical: No cervical adenopathy.  Skin:    General: Skin is warm and dry.  Neurological:     Mental Status: She is alert.  Psychiatric:        Mood and Affect: Mood normal.        Behavior: Behavior normal.     Assessment and Plan   Health Maintenance counseling: 1. Anticipatory guidance: Patient counseled regarding regular dental exams q6 months, eye exams,  avoiding smoking and second hand smoke, limiting alcohol to 1 beverage per day, no illicit drugs.   2. Risk factor reduction:  Advised patient of need for regular exercise and diet rich with fruits and vegetables to reduce risk of heart attack and stroke. Exercise- walks a lot at work.  Wt Readings from Last 3 Encounters:  08/06/22 210 lb 12.8 oz (95.6 kg)  06/07/22 211 lb 8 oz (95.9 kg)  07/09/19 190 lb (86.2 kg)   3. Immunizations/screenings/ancillary studies Immunization History  Administered Date(s) Administered   [employer] Vaccination 01/04/2020  Health Maintenance Due  Topic Date Due   HIV Screening  Never done   Hepatitis C Screening  Never done   DTaP/Tdap/Td (1 - Tdap) Never done   PAP SMEAR-Modifier  08/15/2017   COVID-19 Vaccine (2 - 2023-24 season) 01/18/2022    4. Cervical cancer screening- due, will reschedule 5. Breast cancer screening-  mammogram  6. Colon cancer screening - will wait, not high risk 7. Skin cancer screening- advised regular sunscreen use. Denies worrisome, changing, or new skin lesions.  8. Birth control/STD check- husband had vasectomy 9. Osteoporosis screening- N/A 10. Alcohol screening: none 11. Smoking associated screening (lung cancer screening, AAA screen 65-75, UA)- non- smoker  Acquired hypothyroidism -     TSH -     T4, free  Annual physical exam -     Comprehensive  metabolic panel -     Lipid panel -     CBC with Differential/Platelet  Screening mammogram for breast cancer -     Digital Screening Mammogram, Left and Right; Future   Recommended follow up:  Return for needs PAP smear. No future appointments.  Lab/Order associations: fasting   Jeanie Sewer, NP

## 2022-08-06 NOTE — Patient Instructions (Signed)
It was very nice to see you today!   I will review your lab results via MyChart in a few days.  Please schedule a visit for your PAP smear testing!  Have a great week!   PLEASE NOTE:  If you had any lab tests please let us know if you have not heard back within a few days. You may see your results on MyChart before we have a chance to review them but we will give you a call once they are reviewed by [place]. If we ordered any referrals today, please let us know if you have not heard from their office within the next week.

## 2022-08-07 NOTE — Progress Notes (Signed)
Your glucose, electrolytes, blood count, liver & kidney function are all normal.  Your  cholesterol numbers look good  & just your LDL (bad #) is high. Try to reduce any fried foods, alcohol, nonnutritional snacks e.g. chips/cookies,pies, cakes and candies, fatty meat (red meat), high fat dairy foods:  including cheese, milk, ice cream.  Increase fruits/vegetables/fiber.   Continue or restart an exercise routine, shooting for 24min 5-7days per week.   Your thyroid level is normal, but on the lower end of normal. If you are having any symptoms with weight gain, feeling depressed, constipation,  or fatigue, then you may want to try a slightly higher dose. Have you always been on Armour thyroid? did you ever take Levothyroxine?

## 2022-09-01 ENCOUNTER — Telehealth: Payer: Managed Care, Other (non HMO) | Admitting: Family

## 2022-09-01 ENCOUNTER — Other Ambulatory Visit: Payer: Self-pay | Admitting: Family

## 2022-09-01 DIAGNOSIS — B029 Zoster without complications: Secondary | ICD-10-CM

## 2022-09-01 DIAGNOSIS — E039 Hypothyroidism, unspecified: Secondary | ICD-10-CM

## 2022-09-01 MED ORDER — VALACYCLOVIR HCL 1 G PO TABS: 1000.0000 mg | ORAL_TABLET | Freq: Three times a day (TID) | ORAL | 0 refills | Status: AC

## 2022-09-01 NOTE — Progress Notes (Signed)
E-visit for Shingles   We are sorry that you are not feeling well. Here is how we plan to help!  Based on what you shared with me it looks like you have shingles.  Shingles or herpes zoster, is a common infection of the nerves.  It is a painful rash caused by the herpes zoster virus.  This is the same virus that causes chickenpox.  After a person has chickenpox, the virus remains inactive in the nerve cells.  Years later, the virus can become active again and travel to the skin.  It typically will appear on one side of the face or body.  Burning or shooting pain, tingling, or itching are early signs of the infection.  Blisters typically scab over in 7 to 10 days and clear up within 2-4 weeks. Shingles is only contagious to people that have never had the chickenpox, the chickenpox vaccine, or anyone who has a compromised immune system.  You should avoid contact with these type of people until your blisters scab over.  I have prescribed Valacyclovir 1g three times daily for 7 days   HOME CARE: Apply ice packs (wrapped in a thin towel), cool compresses, or soak in cool bath to help reduce pain. Use calamine lotion to calm itchy skin. Avoid scratching the rash. Avoid direct sunlight.  GET HELP RIGHT AWAY IF: Symptoms that don't away after treatment. A rash or blisters near your eye. Increased drainage, fever, or rash after treatment. Severe pain that doesn't go away.   MAKE SURE YOU   Understand these instructions. Will watch your condition. Will get help right away if you are not doing well or get worse.  Thank you for choosing an e-visit.  Your e-visit answers were reviewed by a board certified advanced clinical practitioner to complete your personal care plan. Depending upon the condition, your plan could have included both over the counter or prescription medications.  Please review your pharmacy choice. Make sure the pharmacy is open so you can pick up prescription now. If there is  a problem, you may contact your provider through [employer] and have the prescription routed to another pharmacy.  Your safety is important to [place]. If you have drug allergies check your prescription carefully.   For the next 24 hours you can use MyChart to ask questions about today's visit, request a non-urgent call back, or ask for a work or school excuse. You will get an email in the next two days asking about your experience. I hope that your e-visit has been valuable and will speed your recovery.   Approximately 5 minutes was spent documenting and reviewing patient's chart.    Called and spoke with patient, denies any itching, reports burning pain. Rash only on left leg.

## 2022-09-24 ENCOUNTER — Ambulatory Visit
Admission: RE | Admit: 2022-09-24 | Discharge: 2022-09-24 | Disposition: A | Discharge status: Home / Self Care | Payer: Managed Care, Other (non HMO) | Source: Ambulatory Visit | Attending: Family | Admitting: Family

## 2022-09-24 DIAGNOSIS — Z1231 Encounter for screening mammogram for malignant neoplasm of breast: Secondary | ICD-10-CM

## 2022-12-20 ENCOUNTER — Other Ambulatory Visit: Payer: Self-pay | Admitting: Family

## 2022-12-20 DIAGNOSIS — E039 Hypothyroidism, unspecified: Secondary | ICD-10-CM

## 2022-12-21 NOTE — Telephone Encounter (Signed)
I don't see the labwork, I see an UC visit for 8/2 and a bunch of old labs but none for that date. Can you see a TSH or T4 in the note? I have refilled RX, but she needs to come in for repeat labs please with office visit. thx

## 2022-12-23 ENCOUNTER — Other Ambulatory Visit: Payer: Self-pay

## 2022-12-23 DIAGNOSIS — E039 Hypothyroidism, unspecified: Secondary | ICD-10-CM

## 2022-12-23 MED ORDER — THYROID 30 MG PO TABS: ORAL_TABLET | ORAL | 0 refills | Status: DC

## 2022-12-23 MED ORDER — ARMOUR THYROID 60 MG PO TABS: 60.0000 mg | ORAL_TABLET | Freq: Every day | ORAL | 0 refills | Status: DC

## 2022-12-23 NOTE — Telephone Encounter (Signed)
Either way, she should get new blood work to be sure labs ok since dose was changed. Please schedule, but ok to send 30 day supply & if she ran out, she should wait 2 weeks before appt.

## 2023-01-15 ENCOUNTER — Other Ambulatory Visit: Payer: Self-pay | Admitting: Family

## 2023-01-15 DIAGNOSIS — E039 Hypothyroidism, unspecified: Secondary | ICD-10-CM

## 2023-01-28 ENCOUNTER — Ambulatory Visit: Payer: Managed Care, Other (non HMO) | Admitting: Family

## 2023-01-28 NOTE — Progress Notes (Deleted)
   Patient ID: Chelsea Fields, female    DOB: [birth year], 42 y.o.   MRN: [id number]  No chief complaint on file.   HPI:    Assessment & Plan:  There are no diagnoses linked to this encounter.  Subjective:    Outpatient Medications Prior to Visit  Medication Sig Dispense Refill   ARMOUR THYROID 60 MG tablet Take 1 tablet (60 mg total) by mouth daily. 30 tablet 0   thyroid (ARMOUR THYROID) 30 MG tablet TAKE 1 TABLET BY MOUTH DAILY 30 tablet 0   No facility-administered medications prior to visit.   Past Medical History:  Diagnosis Date   Migraine    Thyroid disease    Past Surgical History:  Procedure Laterality Date   WISDOM TOOTH EXTRACTION     Allergies  Allergen Reactions   Amoxicillin Nausea Only and Nausea And Vomiting      Objective:    Physical Exam Vitals and nursing note reviewed.  Constitutional:      Appearance: Normal appearance.  Cardiovascular:     Rate and Rhythm: Normal rate and regular rhythm.  Pulmonary:     Effort: Pulmonary effort is normal.     Breath sounds: Normal breath sounds.  Musculoskeletal:        General: Normal range of motion.  Skin:    General: Skin is warm and dry.  Neurological:     Mental Status: She is alert.  Psychiatric:        Mood and Affect: Mood normal.        Behavior: Behavior normal.    There were no vitals taken for this visit. Wt Readings from Last 3 Encounters:  08/06/22 210 lb 12.8 oz (95.6 kg)  06/07/22 211 lb 8 oz (95.9 kg)  07/09/19 190 lb (86.2 kg)       Dulce Sellar, NP

## 2023-02-09 ENCOUNTER — Telehealth: Payer: Managed Care, Other (non HMO) | Admitting: Family

## 2023-02-09 ENCOUNTER — Other Ambulatory Visit: Payer: Self-pay | Admitting: Family

## 2023-02-09 DIAGNOSIS — J029 Acute pharyngitis, unspecified: Secondary | ICD-10-CM

## 2023-02-09 DIAGNOSIS — E039 Hypothyroidism, unspecified: Secondary | ICD-10-CM

## 2023-02-09 MED ORDER — CLINDAMYCIN HCL 300 MG PO CAPS: 300.0000 mg | ORAL_CAPSULE | Freq: Three times a day (TID) | ORAL | 0 refills | Status: AC

## 2023-02-09 NOTE — Progress Notes (Signed)
E-Visit for Sore Throat - Strep Symptoms  We are sorry that you are not feeling well.  Here is how we plan to help!  Based on what you have shared with me it is likely that you have strep pharyngitis.  Strep pharyngitis is inflammation and infection in the back of the throat.  This is an infection cause by bacteria and is treated with antibiotics.  I have prescribed Clindamycin 300 mg three times a day for 10 days. For throat pain, we recommend over the counter oral pain relief medications such as acetaminophen or aspirin, or anti-inflammatory medications such as ibuprofen or naproxen sodium. Topical treatments such as oral throat lozenges or sprays may be used as needed. Strep infections are not as easily transmitted as other respiratory infections, however we still recommend that you avoid close contact with loved ones, especially the very young and elderly.  Remember to wash your hands thoroughly throughout the day as this is the number one way to prevent the spread of infection and wipe down door knobs and counters with disinfectant.   Home Care: Only take medications as instructed by your medical team. Complete the entire course of an antibiotic. Do not take these medications with alcohol. A steam or ultrasonic humidifier can help congestion.  You can place a towel over your head and breathe in the steam from hot water coming from a faucet. Avoid close contacts especially the very young and the elderly. Cover your mouth when you cough or sneeze. Always remember to wash your hands.  Get Help Right Away If: You develop worsening fever or sinus pain. You develop a severe head ache or visual changes. Your symptoms persist after you have completed your treatment plan.  Make sure you Understand these instructions. Will watch your condition. Will get help right away if you are not doing well or get worse.   Thank you for choosing an e-visit.  Your e-visit answers were reviewed by a board  certified advanced clinical practitioner to complete your personal care plan. Depending upon the condition, your plan could have included both over the counter or prescription medications.  Please review your pharmacy choice. Make sure the pharmacy is open so you can pick up prescription now. If there is a problem, you may contact your provider through [employer] and have the prescription routed to another pharmacy.  Your safety is important to [place]. If you have drug allergies check your prescription carefully.   For the next 24 hours you can use MyChart to ask questions about today's visit, request a non-urgent call back, or ask for a work or school excuse. You will get an email in the next two days asking about your experience. I hope that your e-visit has been valuable and will speed your recovery.  Approximately 5 minutes was spent documenting and reviewing patient's chart.

## 2023-02-10 MED ORDER — ARMOUR THYROID 60 MG PO TABS: 60.0000 mg | ORAL_TABLET | Freq: Every day | ORAL | 0 refills | Status: DC

## 2023-03-17 ENCOUNTER — Other Ambulatory Visit: Payer: Self-pay | Admitting: Family

## 2023-03-17 DIAGNOSIS — E039 Hypothyroidism, unspecified: Secondary | ICD-10-CM

## 2023-03-19 ENCOUNTER — Other Ambulatory Visit: Payer: Self-pay | Admitting: Family

## 2023-03-19 DIAGNOSIS — E039 Hypothyroidism, unspecified: Secondary | ICD-10-CM

## 2023-03-19 MED ORDER — ARMOUR THYROID 60 MG PO TABS: 60.0000 mg | ORAL_TABLET | Freq: Every day | ORAL | 0 refills | Status: DC

## 2023-06-16 ENCOUNTER — Other Ambulatory Visit: Payer: Self-pay | Admitting: Family

## 2023-06-16 DIAGNOSIS — E039 Hypothyroidism, unspecified: Secondary | ICD-10-CM

## 2023-07-13 ENCOUNTER — Other Ambulatory Visit: Payer: Self-pay | Admitting: Family

## 2023-07-13 DIAGNOSIS — E039 Hypothyroidism, unspecified: Secondary | ICD-10-CM

## 2023-08-07 ENCOUNTER — Ambulatory Visit (INDEPENDENT_AMBULATORY_CARE_PROVIDER_SITE_OTHER): Payer: Managed Care, Other (non HMO) | Admitting: Family

## 2023-08-07 VITALS — BP 112/75 | HR 70 | Temp 97.7°F | Wt 235.0 lb

## 2023-08-07 DIAGNOSIS — Z01419 Encounter for gynecological examination (general) (routine) without abnormal findings: Secondary | ICD-10-CM

## 2023-08-07 DIAGNOSIS — E010 Iodine-deficiency related diffuse (endemic) goiter: Secondary | ICD-10-CM | POA: Insufficient documentation

## 2023-08-07 DIAGNOSIS — Z0001 Encounter for general adult medical examination with abnormal findings: Secondary | ICD-10-CM

## 2023-08-07 DIAGNOSIS — Z6832 Body mass index (BMI) 32.0-32.9, adult: Secondary | ICD-10-CM

## 2023-08-07 DIAGNOSIS — N926 Irregular menstruation, unspecified: Secondary | ICD-10-CM | POA: Diagnosis not present

## 2023-08-07 DIAGNOSIS — Z114 Encounter for screening for human immunodeficiency virus [HIV]: Secondary | ICD-10-CM

## 2023-08-07 DIAGNOSIS — Z0000 Encounter for general adult medical examination without abnormal findings: Secondary | ICD-10-CM

## 2023-08-07 DIAGNOSIS — Z1159 Encounter for screening for other viral diseases: Secondary | ICD-10-CM | POA: Diagnosis not present

## 2023-08-07 DIAGNOSIS — E6609 Other obesity due to excess calories: Secondary | ICD-10-CM

## 2023-08-07 DIAGNOSIS — R7989 Other specified abnormal findings of blood chemistry: Secondary | ICD-10-CM | POA: Diagnosis not present

## 2023-08-07 DIAGNOSIS — Z803 Family history of malignant neoplasm of breast: Secondary | ICD-10-CM | POA: Insufficient documentation

## 2023-08-07 DIAGNOSIS — E66811 Obesity, class 1: Secondary | ICD-10-CM | POA: Insufficient documentation

## 2023-08-07 DIAGNOSIS — E039 Hypothyroidism, unspecified: Secondary | ICD-10-CM

## 2023-08-07 LAB — CBC WITH DIFFERENTIAL/PLATELET
Basophils Absolute: 0.1 10*3/uL (ref 0.0–0.1)
Basophils Relative: 0.8 % (ref 0.0–3.0)
Eosinophils Absolute: 0.2 10*3/uL (ref 0.0–0.7)
Eosinophils Relative: 2.5 % (ref 0.0–5.0)
HCT: 37.6 % (ref 36.0–46.0)
Hemoglobin: 12.2 g/dL (ref 12.0–15.0)
Lymphocytes Relative: 32.4 % (ref 12.0–46.0)
Lymphs Abs: 2.2 10*3/uL (ref 0.7–4.0)
MCHC: 32.4 g/dL (ref 30.0–36.0)
MCV: 85.1 fl (ref 78.0–100.0)
Monocytes Absolute: 0.6 10*3/uL (ref 0.1–1.0)
Monocytes Relative: 8.7 % (ref 3.0–12.0)
Neutro Abs: 3.8 10*3/uL (ref 1.4–7.7)
Neutrophils Relative %: 55.6 % (ref 43.0–77.0)
Platelets: 326 10*3/uL (ref 150.0–400.0)
RBC: 4.41 Mil/uL (ref 3.87–5.11)
RDW: 14.3 % (ref 11.5–15.5)
WBC: 6.9 10*3/uL (ref 4.0–10.5)

## 2023-08-07 LAB — LIPID PANEL
Cholesterol: 185 mg/dL (ref 0–200)
HDL: 51.1 mg/dL (ref >39.00)
LDL Cholesterol: 120 mg/dL — ABNORMAL HIGH (ref 0–99)
NonHDL: 133.77
Total CHOL/HDL Ratio: 4
Triglycerides: 68 mg/dL (ref 0.0–149.0)
VLDL: 13.6 mg/dL (ref 0.0–40.0)

## 2023-08-07 LAB — COMPREHENSIVE METABOLIC PANEL
ALT: 37 U/L — ABNORMAL HIGH (ref 0–35)
AST: 20 U/L (ref 0–37)
Albumin: 4.4 g/dL (ref 3.5–5.2)
Alkaline Phosphatase: 54 U/L (ref 39–117)
BUN: 12 mg/dL (ref 6–23)
CO2: 26 meq/L (ref 19–32)
Calcium: 9.4 mg/dL (ref 8.4–10.5)
Chloride: 105 meq/L (ref 96–112)
Creatinine, Ser: 0.63 mg/dL (ref 0.40–1.20)
GFR: 110.44 mL/min (ref >60.00)
Glucose, Bld: 94 mg/dL (ref 70–99)
Potassium: 3.9 meq/L (ref 3.5–5.1)
Sodium: 138 meq/L (ref 135–145)
Total Bilirubin: 0.5 mg/dL (ref 0.2–1.2)
Total Protein: 7.4 g/dL (ref 6.0–8.3)

## 2023-08-07 LAB — TSH: TSH: 7.15 u[IU]/mL — ABNORMAL HIGH (ref 0.35–5.50)

## 2023-08-07 NOTE — Assessment & Plan Note (Signed)
 25lb weight gain over the last year. 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 -Advised on importance of incorporating exercise at lunch or outside of work -[employer]. Loss strategies reviewed including portion control, less carbs including sweets, eating most of calories earlier in day, drinking 64oz water qd, and establishing daily exercise routine.

## 2023-08-07 NOTE — Patient Instructions (Signed)
 It was very nice to see you today!   I will review your lab results via MyChart in a few days.  I have sent a referral to have the thyroid scan done, they will call you to schedule.  Stay well and enjoy the Spring season!      PLEASE NOTE:  If you had any lab tests please let us know if you have not heard back within a few days. You may see your results on MyChart before we have a chance to review them but we will give you a call once they are reviewed by [place]. If we ordered any referrals today, please let us know if you have not heard from their office within the next week.

## 2023-08-07 NOTE — Progress Notes (Signed)
 Phone [phone]  Subjective:   Patient is a 43 y.o. female presenting for annual physical.    Chief Complaint  Patient presents with   Annual Exam    Fasting w/ labs    Vaginal Bleeding    Pt c/o spotting for 2 weeks. Pt denies cramps.   Discussed the use of AI scribe software for clinical note transcription with the patient, who gave verbal consent to proceed.  History of Present Illness   The patient, with a history of hypothyroidism, presents for a physical and has a c/o irregular menstrual spotting. The patient reports that her menstrual cycles have always been inconsistent, with occasional skipping of months. She recalls a similar episode of prolonged spotting about eight years ago. The patient denies any use of hormonal treatment or birth control. She recently experienced spotting after her period and during a vacation. She is currently not having her period when she believes she should be. The patient's mother also had irregular cycles until she had children and went through menopause late. 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     See problem oriented charting- ROS- full  review of systems was completed and negative other than what is noted above.  The following were reviewed and entered/updated in epic: Past Medical History:  Diagnosis Date   Migraine    Thyroid disease    Patient Active Problem List   Diagnosis Date Noted   Hypothyroidism 08/21/2018   Past Surgical History:  Procedure Laterality Date   WISDOM TOOTH EXTRACTION      Family History  Problem Relation Age of Onset   Hypertension Father    Hypothyroidism Father    Depression Sister    Breast cancer Paternal Aunt     Medications- reviewed and updated Current Outpatient Medications  Medication Sig Dispense Refill   ARMOUR THYROID 60 MG tablet TAKE 1 TABLET BY MOUTH DAILY 30 tablet 0   No current  facility-administered medications for this visit.    Allergies-reviewed and updated Allergies  Allergen Reactions   Amoxicillin Nausea Only and Nausea And Vomiting   Social History   Social History Narrative   ** Merged History Encounter **        Objective:  BP 112/75 (BP Location: Left Arm, Patient Position: Sitting, Cuff Size: Large)   Pulse 70   Temp 97.7 F (36.5 C) (Temporal)   Wt 235 lb (106.6 kg)   LMP 07/02/2023 (Exact Date)   SpO2 98%   BMI 32.78 kg/m  Physical Exam Vitals and nursing note reviewed.  Constitutional:      Appearance: Normal appearance. She is obese.  HENT:     Head: Normocephalic.     Right Ear: Tympanic membrane normal.     Left Ear: Tympanic membrane normal.     Nose: Nose normal.     Mouth/Throat:     Mouth: Mucous membranes are moist.  Eyes:     Pupils: Pupils are equal, round, and reactive to light.  Neck:     Thyroid: Thyromegaly (right side tissue enlargement noted, soft) present.  Cardiovascular:     Rate and Rhythm: Normal rate and regular rhythm.  Pulmonary:     Effort: Pulmonary effort is normal.     Breath sounds: Normal breath sounds.  Musculoskeletal:        General: Normal range of motion.     Cervical back: Normal range of motion.  Lymphadenopathy:     Cervical: No cervical adenopathy.  Skin:    General: Skin is warm and dry.  Neurological:     Mental Status: She is alert.  Psychiatric:        Mood and Affect: Mood normal.        Behavior: Behavior normal.     Assessment and Plan   Health Maintenance counseling: 1. Anticipatory guidance: Patient counseled regarding regular dental exams q6 months, eye exams,  avoiding smoking and second hand smoke, limiting alcohol to 1 beverage per day, no illicit drugs.   2. Risk factor reduction:  Advised patient of need for regular exercise and diet rich with fruits and vegetables to reduce risk of heart attack and stroke. Exercise- walks a lot at work.  Wt Readings from Last  3 Encounters:  08/07/23 235 lb (106.6 kg)  08/06/22 210 lb 12.8 oz (95.6 kg)  06/07/22 211 lb 8 oz (95.9 kg)   3. Immunizations/screenings/ancillary studies Immunization History  Administered Date(s) Administered   Moderna Covid-19 Vaccine Bivalent Booster 53yrs & up 06/21/2020   Moderna Sars-Covid-2 Vaccination 08/13/2019, 09/10/2019, 01/04/2020   Health Maintenance Due  Topic Date Due   HIV Screening  Never done    4. Cervical cancer screening- overdue, sending GYN referral 5. Breast cancer screening-  mammogram  6. Colon cancer screening - still considering 7. Skin cancer screening- advised regular sunscreen use. Denies worrisome, changing, or new skin lesions.  8. Birth control/STD check- husband had vasectomy 9. Osteoporosis screening- N/A 10. Alcohol screening: none 11. Smoking associated screening (lung cancer screening, AAA screen 65-75, UA)- non- smoker  Assessment and Plan    Irregular Menstrual Bleeding - Irregular cycles with spotting post-menstruation. Possibly d/t perimenopause due to age. Discussed hormonal changes and low-dose estrogen therapy options. - Consider low-dose estrogen therapy if menstrual irregularity becomes bothersome. - She will decide on hormone therapy based on preference.  Hypothyroidism - Pt taking armour thyroid daily, no c/o sx or SE. Possible thyroid gland enlargement noted on exam today, pt denies noticing before. Discussed monitoring and biopsy for nodules if necessary. -Check TSH today. - Order thyroid ultrasound to evaluate for nodules.  Breast Cancer Screening - Discussed mammogram frequency due to family history of breast cancer (Aunt). Advised biennial mammograms unless risk factors indicate otherwise. - Schedule mammogram based on family history and previous findings.  General Health Maintenance - Due for Pap smear, referring to GYN. Discussed Cologuard for colon cancer screening (as pt hesitant to do Colonoscopy) and benefits of  exercise for health maintenance. -Check CPE labs today. - Encourage scheduling a Pap smear. - Discussed Cologuard as an option for colon cancer screening. - Encouraged increased physical activity. - Discuss benefits of exercise for overall health.   Class 1 obesity due to excess calories without serious comorbidity with body mass index (BMI) of 32.0 to 32.9 in adult - 25lb weight gain over the last year. 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 -Advised on importance of incorporating exercise at lunch or outside of work -[employer]. Loss strategies reviewed including portion control, less carbs including sweets, eating most of calories earlier in day, drinking 64oz water qd, and establishing daily exercise routine.   Screening for HIV (human immunodeficiency virus) -     HIV Antibody (routine testing w rflx)  Need for hepatitis C screening test -     Hepatitis C antibody  Encounter for gynecological examination -     Ambulatory  referral to Gynecology  Recommended follow up:  Return in about 1 year (around 08/06/2024) for any future concerns, Complete physical w/fasting labs. No future appointments.  Lab/Order associations: fasting   Dulce Sellar, NP

## 2023-08-08 LAB — HEPATITIS C ANTIBODY: Hepatitis C Ab: NONREACTIVE

## 2023-08-08 LAB — HIV ANTIBODY (ROUTINE TESTING W REFLEX): HIV 1&2 Ab, 4th Generation: NONREACTIVE

## 2023-08-11 ENCOUNTER — Ambulatory Visit
Admission: RE | Admit: 2023-08-11 | Discharge: 2023-08-11 | Disposition: A | Discharge status: Home / Self Care | Source: Ambulatory Visit | Attending: Family | Admitting: Family

## 2023-08-11 ENCOUNTER — Encounter: Payer: Self-pay | Admitting: Family

## 2023-08-11 ENCOUNTER — Other Ambulatory Visit: Payer: Self-pay | Admitting: Family

## 2023-08-11 DIAGNOSIS — E039 Hypothyroidism, unspecified: Secondary | ICD-10-CM

## 2023-08-11 DIAGNOSIS — E010 Iodine-deficiency related diffuse (endemic) goiter: Secondary | ICD-10-CM

## 2023-08-11 MED ORDER — THYROID 60 MG PO TABS: 60.0000 mg | ORAL_TABLET | Freq: Every day | ORAL | 3 refills | Status: DC

## 2023-08-13 ENCOUNTER — Encounter: Payer: Self-pay | Admitting: Family

## 2023-08-13 ENCOUNTER — Other Ambulatory Visit: Payer: Self-pay | Admitting: Family

## 2023-08-13 DIAGNOSIS — E039 Hypothyroidism, unspecified: Secondary | ICD-10-CM

## 2023-08-13 MED ORDER — THYROID 60 MG PO TABS: 60.0000 mg | ORAL_TABLET | Freq: Every day | ORAL | 3 refills | Status: AC

## 2023-08-13 NOTE — Addendum Note (Signed)
 Addended byDulce Sellar on: 08/13/2023 11:05 PM   Modules accepted: Orders

## 2023-08-20 ENCOUNTER — Other Ambulatory Visit: Payer: Self-pay | Admitting: Family

## 2023-08-20 DIAGNOSIS — E039 Hypothyroidism, unspecified: Secondary | ICD-10-CM

## 2023-08-21 ENCOUNTER — Telehealth: Payer: Self-pay

## 2023-08-21 NOTE — Telephone Encounter (Signed)
 Copied from CRM [id number]. Topic: Clinical - Medication Question >> Aug 21, 2023 12:19 PM Aisha D wrote: Reason for CRM: Patient stated that she went to the pharmacy to pick up her medication for the thyroid (ARMOUR THYROID) 60 MG tablet. Patient stated that the pharmacy is stating that the medication hasn't been approved prescriber. Patient stated that she was speaking with Dr.Hudnell through MyChart and was informed that it was approved. Patient wants to know if the medication can be resent to the pharmacy for approval.  PA request sent to PA team.

## 2023-08-26 ENCOUNTER — Other Ambulatory Visit (HOSPITAL_COMMUNITY): Payer: Self-pay

## 2023-08-26 ENCOUNTER — Telehealth: Payer: Self-pay

## 2023-08-26 NOTE — Telephone Encounter (Signed)
 Did Chelsea Fields pick up the 90 day supply in March? or she didn't get because required a PA?

## 2023-08-26 NOTE — Telephone Encounter (Signed)
 Pharmacy Patient Advocate Encounter   Received notification from Physician's Office that prior authorization for ARMOUR THYROID 60MG  is required/requested.   Insurance verification completed.   The patient is insured through  [employer]  .   Per test claim: Refill too soon. PA is not needed at this time. Medication was filled 08/13/23. Next eligible fill date is 09/06/23.

## 2023-08-26 NOTE — Telephone Encounter (Signed)
-----   Message from [facility] sent at 08/21/2023  1:38 PM EDT ----- Regarding: PA request Please start PA for pt medication thyroid (ARMOUR THYROID) 60 MG tablet.  Thank you!

## 2023-09-02 ENCOUNTER — Other Ambulatory Visit: Payer: Self-pay | Admitting: Family

## 2023-09-02 DIAGNOSIS — Z1231 Encounter for screening mammogram for malignant neoplasm of breast: Secondary | ICD-10-CM

## 2023-09-15 ENCOUNTER — Encounter: Payer: Self-pay | Admitting: Advanced Practice Midwife

## 2023-09-15 ENCOUNTER — Other Ambulatory Visit (HOSPITAL_COMMUNITY)
Admission: RE | Admit: 2023-09-15 | Discharge: 2023-09-15 | Disposition: A | Discharge status: Home / Self Care | Source: Ambulatory Visit | Attending: Advanced Practice Midwife | Admitting: Advanced Practice Midwife

## 2023-09-15 ENCOUNTER — Ambulatory Visit: Admitting: Advanced Practice Midwife

## 2023-09-15 VITALS — BP 127/77 | HR 76 | Ht 71.0 in | Wt 237.9 lb

## 2023-09-15 DIAGNOSIS — Z01419 Encounter for gynecological examination (general) (routine) without abnormal findings: Secondary | ICD-10-CM | POA: Diagnosis not present

## 2023-09-15 DIAGNOSIS — Z124 Encounter for screening for malignant neoplasm of cervix: Secondary | ICD-10-CM

## 2023-09-15 DIAGNOSIS — Z1339 Encounter for screening examination for other mental health and behavioral disorders: Secondary | ICD-10-CM | POA: Diagnosis not present

## 2023-09-15 DIAGNOSIS — Z803 Family history of malignant neoplasm of breast: Secondary | ICD-10-CM | POA: Diagnosis not present

## 2023-09-15 NOTE — Progress Notes (Signed)
   Subjective:     Chelsea Fields is a 43 y.o. female here at [facility] for a routine exam.  Current complaints: no gyn concerns, slightly irregular menses, unchanged recently.  Pt taking Synthroid, TSH has come down but not normal, last free T3 and T4 were last year. Pt has PCP, but per recent visit, PCP wants to test pt off of thyroid  medication.  Pt still has fatigue, temperature intolerance and is concerned about stopping the medicine.  Personal and family health history reviewed: yes.  Do you have a primary care provider? yes Do you feel safe at home? yes  Flowsheet Row Office Visit from 09/15/2023 in [facility] Total Score 0       There are no preventive care reminders to display for this patient.   Risk factors for chronic health problems: Smoking: Alchohol/how much: Pt BMI: Body mass index is 33.18 kg/m.   Gynecologic History Patient's last menstrual period was 09/08/2023 (exact date). Contraception: vasectomy Last Pap: 2016. Results were: normal Last mammogram: 09/24/22. Results were: normal  Obstetric History OB History  No obstetric history on file.     The following portions of the patient's history were reviewed and updated as appropriate: allergies, current medications, past family history, past medical history, past social history, past surgical history, and problem list.  Review of Systems Pertinent items noted in HPI and remainder of comprehensive ROS otherwise negative.    Objective:   Today's Vitals   09/15/23 0819  BP: 127/77  Pulse: 76  Weight: 237 lb 14.4 oz (107.9 kg)  Height: 5\' 11"  (1.803 m)   Body mass index is 33.18 kg/m.  VS reviewed, nursing note reviewed,  Constitutional: well developed, well nourished, no distress HEENT: normocephalic, thyroid  without enlargement or mass HEART: RRR, no murmurs rubs/gallops RESP: clear and equal to auscultation bilaterally in all lobes  Breast Exam:   exam performed: right breast normal without mass, skin or nipple changes or axillary nodes, left breast normal without mass, skin or nipple changes or axillary nodes Abdomen: soft Neuro: alert and oriented x 3 Skin: warm, dry Psych: affect normal Pelvic exam:  Performed: Cervix pink, visually closed, without lesion, scant white creamy discharge, vaginal walls and external genitalia normal Bimanual exam: Cervix 0/long/high, firm, anterior, neg CMT, uterus nontender, nonenlarged, adnexa without tenderness, enlargement, or mass        Assessment/Plan:   1. Encounter for annual routine gynecological examination (Primary) --Pap today, discussed pap Q 3 or 5 years, reasons for recommendation. --Mammogram scheduled in May  2. Screening for cervical cancer  - Cytology - PAP( [place])  3. Family history of breast cancer in sister --sister and aunt with breast ca --Pt to f/u after completing breast ca risk calculator. If risks above general population, consider additional surveillance, genetic testing.     Return in about 1 year (around 09/14/2024).   Arlester Bence, CNM 11:24 AM

## 2023-09-15 NOTE — Progress Notes (Signed)
 Pt presents for annual and to est. Care. Pt has questions about her medication dosage and her THS levels

## 2023-09-17 LAB — CYTOLOGY - PAP
Comment: NEGATIVE
Diagnosis: NEGATIVE
Diagnosis: REACTIVE
High risk HPV: NEGATIVE

## 2023-09-22 ENCOUNTER — Encounter

## 2023-09-25 ENCOUNTER — Encounter (HOSPITAL_COMMUNITY): Payer: Self-pay

## 2023-10-02 ENCOUNTER — Ambulatory Visit
Admission: RE | Admit: 2023-10-02 | Discharge: 2023-10-02 | Disposition: A | Discharge status: Home / Self Care | Source: Ambulatory Visit

## 2023-10-02 DIAGNOSIS — Z1231 Encounter for screening mammogram for malignant neoplasm of breast: Secondary | ICD-10-CM

## 2024-08-09 ENCOUNTER — Encounter: Admitting: Family
